# Patient Record
Sex: Female | Born: 1945 | Race: White | Hispanic: No | Marital: Married | Smoking: Never smoker
Health system: Southern US, Community
[De-identification: ages and names within clinical notes are randomized; demographics above are authoritative.]

## PROBLEM LIST (undated history)

## (undated) DIAGNOSIS — I10 Essential (primary) hypertension: Secondary | ICD-10-CM

## (undated) DIAGNOSIS — C50919 Malignant neoplasm of unspecified site of unspecified female breast: Secondary | ICD-10-CM

## (undated) DIAGNOSIS — C719 Malignant neoplasm of brain, unspecified: Secondary | ICD-10-CM

## (undated) DIAGNOSIS — C419 Malignant neoplasm of bone and articular cartilage, unspecified: Secondary | ICD-10-CM

## (undated) DIAGNOSIS — C569 Malignant neoplasm of unspecified ovary: Secondary | ICD-10-CM

## (undated) HISTORY — DX: Malignant neoplasm of unspecified site of unspecified female breast: C50.919

## (undated) HISTORY — DX: Essential (primary) hypertension: I10

## (undated) HISTORY — PX: OTHER SURGICAL HISTORY: SHX169

---

## 1995-02-07 DIAGNOSIS — C50919 Malignant neoplasm of unspecified site of unspecified female breast: Secondary | ICD-10-CM

## 1995-02-07 HISTORY — PX: OTHER SURGICAL HISTORY: SHX169

## 1995-02-07 HISTORY — PX: BREAST LUMPECTOMY WITH AXILLARY LYMPH NODE DISSECTION: SHX5756

## 1995-02-07 HISTORY — DX: Malignant neoplasm of unspecified site of unspecified female breast: C50.919

## 1998-11-26 ENCOUNTER — Other Ambulatory Visit: Admission: RE | Admit: 1998-11-26 | Discharge: 1998-11-26 | Payer: Self-pay | Admitting: Obstetrics & Gynecology

## 2000-02-17 ENCOUNTER — Other Ambulatory Visit: Admission: RE | Admit: 2000-02-17 | Discharge: 2000-02-17 | Payer: Self-pay | Admitting: Obstetrics & Gynecology

## 2000-02-24 ENCOUNTER — Ambulatory Visit (HOSPITAL_COMMUNITY): Admission: RE | Admit: 2000-02-24 | Discharge: 2000-02-24 | Payer: Self-pay | Admitting: Family Medicine

## 2000-02-24 ENCOUNTER — Encounter: Payer: Self-pay | Admitting: Family Medicine

## 2001-02-06 DIAGNOSIS — C569 Malignant neoplasm of unspecified ovary: Secondary | ICD-10-CM

## 2001-02-06 HISTORY — DX: Malignant neoplasm of unspecified ovary: C56.9

## 2001-02-06 HISTORY — PX: OTHER SURGICAL HISTORY: SHX169

## 2001-02-20 ENCOUNTER — Encounter (INDEPENDENT_AMBULATORY_CARE_PROVIDER_SITE_OTHER): Payer: Self-pay | Admitting: Specialist

## 2001-02-20 ENCOUNTER — Ambulatory Visit (HOSPITAL_COMMUNITY): Admission: RE | Admit: 2001-02-20 | Discharge: 2001-02-20 | Payer: Self-pay | Admitting: Gastroenterology

## 2001-03-28 ENCOUNTER — Other Ambulatory Visit: Admission: RE | Admit: 2001-03-28 | Discharge: 2001-03-28 | Payer: Self-pay | Admitting: Obstetrics & Gynecology

## 2001-04-26 ENCOUNTER — Ambulatory Visit (HOSPITAL_COMMUNITY): Admission: RE | Admit: 2001-04-26 | Discharge: 2001-04-26 | Payer: Self-pay | Admitting: Obstetrics & Gynecology

## 2001-04-26 ENCOUNTER — Encounter: Payer: Self-pay | Admitting: Obstetrics & Gynecology

## 2001-05-09 ENCOUNTER — Ambulatory Visit (HOSPITAL_COMMUNITY): Admission: RE | Admit: 2001-05-09 | Discharge: 2001-05-09 | Payer: Self-pay | Admitting: Obstetrics & Gynecology

## 2001-05-09 ENCOUNTER — Encounter (INDEPENDENT_AMBULATORY_CARE_PROVIDER_SITE_OTHER): Payer: Self-pay | Admitting: *Deleted

## 2001-05-21 ENCOUNTER — Ambulatory Visit: Admission: RE | Admit: 2001-05-21 | Discharge: 2001-05-21 | Payer: Self-pay | Admitting: Gynecology

## 2001-05-27 ENCOUNTER — Encounter: Payer: Self-pay | Admitting: Obstetrics & Gynecology

## 2001-05-28 ENCOUNTER — Inpatient Hospital Stay (HOSPITAL_COMMUNITY): Admission: RE | Admit: 2001-05-28 | Discharge: 2001-06-01 | Payer: Self-pay | Admitting: Obstetrics & Gynecology

## 2001-05-28 ENCOUNTER — Encounter (INDEPENDENT_AMBULATORY_CARE_PROVIDER_SITE_OTHER): Payer: Self-pay

## 2001-06-01 ENCOUNTER — Other Ambulatory Visit: Admission: RE | Admit: 2001-06-01 | Discharge: 2001-06-01 | Payer: Self-pay | Admitting: Obstetrics & Gynecology

## 2001-06-12 ENCOUNTER — Ambulatory Visit: Admission: RE | Admit: 2001-06-12 | Discharge: 2001-06-12 | Payer: Self-pay | Admitting: Gynecology

## 2001-07-09 ENCOUNTER — Ambulatory Visit: Admission: RE | Admit: 2001-07-09 | Discharge: 2001-07-09 | Payer: Self-pay | Admitting: Gynecology

## 2001-07-10 ENCOUNTER — Ambulatory Visit (HOSPITAL_COMMUNITY): Admission: RE | Admit: 2001-07-10 | Discharge: 2001-07-10 | Payer: Self-pay | Admitting: Gastroenterology

## 2001-07-22 ENCOUNTER — Ambulatory Visit (HOSPITAL_COMMUNITY): Admission: RE | Admit: 2001-07-22 | Discharge: 2001-07-22 | Payer: Self-pay | Admitting: Oncology

## 2001-07-22 ENCOUNTER — Encounter: Payer: Self-pay | Admitting: Oncology

## 2001-10-08 ENCOUNTER — Ambulatory Visit: Admission: RE | Admit: 2001-10-08 | Discharge: 2001-10-08 | Payer: Self-pay | Admitting: Gynecology

## 2001-12-18 ENCOUNTER — Ambulatory Visit (HOSPITAL_COMMUNITY): Admission: RE | Admit: 2001-12-18 | Discharge: 2001-12-18 | Payer: Self-pay | Admitting: Oncology

## 2001-12-18 ENCOUNTER — Encounter: Payer: Self-pay | Admitting: Oncology

## 2002-03-06 ENCOUNTER — Ambulatory Visit (HOSPITAL_COMMUNITY): Admission: RE | Admit: 2002-03-06 | Discharge: 2002-03-06 | Payer: Self-pay | Admitting: Oncology

## 2002-03-06 ENCOUNTER — Encounter: Payer: Self-pay | Admitting: Oncology

## 2002-03-18 ENCOUNTER — Ambulatory Visit: Admission: RE | Admit: 2002-03-18 | Discharge: 2002-03-18 | Payer: Self-pay | Admitting: Gynecology

## 2002-09-19 ENCOUNTER — Encounter: Payer: Self-pay | Admitting: Oncology

## 2002-09-19 ENCOUNTER — Ambulatory Visit (HOSPITAL_COMMUNITY): Admission: RE | Admit: 2002-09-19 | Discharge: 2002-09-19 | Payer: Self-pay | Admitting: Oncology

## 2002-10-09 ENCOUNTER — Ambulatory Visit (HOSPITAL_COMMUNITY): Admission: RE | Admit: 2002-10-09 | Discharge: 2002-10-09 | Payer: Self-pay | Admitting: Oncology

## 2002-10-09 ENCOUNTER — Encounter: Payer: Self-pay | Admitting: Oncology

## 2002-10-09 ENCOUNTER — Encounter (INDEPENDENT_AMBULATORY_CARE_PROVIDER_SITE_OTHER): Payer: Self-pay | Admitting: Specialist

## 2002-10-09 ENCOUNTER — Other Ambulatory Visit: Admission: RE | Admit: 2002-10-09 | Discharge: 2002-10-09 | Payer: Self-pay | Admitting: Interventional Radiology

## 2002-10-22 ENCOUNTER — Ambulatory Visit (HOSPITAL_COMMUNITY): Admission: RE | Admit: 2002-10-22 | Discharge: 2002-10-22 | Payer: Self-pay | Admitting: Oncology

## 2002-10-22 ENCOUNTER — Encounter: Payer: Self-pay | Admitting: Oncology

## 2002-10-30 ENCOUNTER — Ambulatory Visit (HOSPITAL_COMMUNITY): Admission: RE | Admit: 2002-10-30 | Discharge: 2002-10-30 | Payer: Self-pay | Admitting: Oncology

## 2002-10-30 ENCOUNTER — Encounter: Payer: Self-pay | Admitting: Oncology

## 2002-10-31 ENCOUNTER — Ambulatory Visit: Admission: RE | Admit: 2002-10-31 | Discharge: 2002-12-06 | Payer: Self-pay | Admitting: Radiation Oncology

## 2002-12-17 ENCOUNTER — Ambulatory Visit (HOSPITAL_COMMUNITY): Admission: RE | Admit: 2002-12-17 | Discharge: 2002-12-17 | Payer: Self-pay | Admitting: Oncology

## 2003-02-07 HISTORY — PX: COMBINED HYSTERECTOMY ABDOMINAL W/ A&P REPAIR / OOPHORECTOMY: SUR292

## 2003-02-16 ENCOUNTER — Inpatient Hospital Stay (HOSPITAL_COMMUNITY): Admission: RE | Admit: 2003-02-16 | Discharge: 2003-02-26 | Payer: Self-pay | Admitting: Thoracic Surgery

## 2003-02-16 ENCOUNTER — Encounter (INDEPENDENT_AMBULATORY_CARE_PROVIDER_SITE_OTHER): Payer: Self-pay | Admitting: *Deleted

## 2003-03-03 ENCOUNTER — Encounter: Admission: RE | Admit: 2003-03-03 | Discharge: 2003-03-03 | Payer: Self-pay | Admitting: Thoracic Surgery

## 2003-03-18 ENCOUNTER — Encounter: Admission: RE | Admit: 2003-03-18 | Discharge: 2003-03-18 | Payer: Self-pay | Admitting: Thoracic Surgery

## 2003-03-24 ENCOUNTER — Ambulatory Visit: Admission: RE | Admit: 2003-03-24 | Discharge: 2003-04-14 | Payer: Self-pay | Admitting: Radiation Oncology

## 2003-04-30 ENCOUNTER — Encounter: Admission: RE | Admit: 2003-04-30 | Discharge: 2003-04-30 | Payer: Self-pay | Admitting: Thoracic Surgery

## 2003-06-11 ENCOUNTER — Encounter: Admission: RE | Admit: 2003-06-11 | Discharge: 2003-06-11 | Payer: Self-pay | Admitting: Thoracic Surgery

## 2003-06-15 ENCOUNTER — Ambulatory Visit (HOSPITAL_COMMUNITY): Admission: RE | Admit: 2003-06-15 | Discharge: 2003-06-15 | Payer: Self-pay | Admitting: Oncology

## 2003-08-12 ENCOUNTER — Encounter: Admission: RE | Admit: 2003-08-12 | Discharge: 2003-08-12 | Payer: Self-pay | Admitting: Thoracic Surgery

## 2003-09-21 ENCOUNTER — Ambulatory Visit (HOSPITAL_COMMUNITY): Admission: RE | Admit: 2003-09-21 | Discharge: 2003-09-21 | Payer: Self-pay | Admitting: Oncology

## 2003-10-20 ENCOUNTER — Ambulatory Visit (HOSPITAL_COMMUNITY): Admission: RE | Admit: 2003-10-20 | Discharge: 2003-10-20 | Payer: Self-pay | Admitting: Oncology

## 2003-11-03 ENCOUNTER — Ambulatory Visit: Admission: RE | Admit: 2003-11-03 | Discharge: 2003-12-16 | Payer: Self-pay | Admitting: Radiation Oncology

## 2003-11-05 ENCOUNTER — Encounter: Admission: RE | Admit: 2003-11-05 | Discharge: 2003-11-05 | Payer: Self-pay | Admitting: Thoracic Surgery

## 2003-12-22 ENCOUNTER — Ambulatory Visit (HOSPITAL_COMMUNITY): Admission: RE | Admit: 2003-12-22 | Discharge: 2003-12-22 | Payer: Self-pay | Admitting: Oncology

## 2003-12-25 ENCOUNTER — Ambulatory Visit: Payer: Self-pay | Admitting: Oncology

## 2004-01-18 ENCOUNTER — Ambulatory Visit (HOSPITAL_COMMUNITY): Admission: RE | Admit: 2004-01-18 | Discharge: 2004-01-18 | Payer: Self-pay | Admitting: Oncology

## 2004-01-19 ENCOUNTER — Ambulatory Visit (HOSPITAL_COMMUNITY): Admission: RE | Admit: 2004-01-19 | Discharge: 2004-01-19 | Payer: Self-pay | Admitting: Oncology

## 2004-02-07 DIAGNOSIS — C419 Malignant neoplasm of bone and articular cartilage, unspecified: Secondary | ICD-10-CM

## 2004-02-07 HISTORY — DX: Malignant neoplasm of bone and articular cartilage, unspecified: C41.9

## 2004-02-22 ENCOUNTER — Ambulatory Visit: Payer: Self-pay | Admitting: Oncology

## 2004-02-24 ENCOUNTER — Encounter
Admission: RE | Admit: 2004-02-24 | Discharge: 2004-05-19 | Payer: Self-pay | Admitting: Physical Medicine and Rehabilitation

## 2004-02-26 ENCOUNTER — Ambulatory Visit: Payer: Self-pay | Admitting: Physical Medicine and Rehabilitation

## 2004-03-08 ENCOUNTER — Encounter: Admission: RE | Admit: 2004-03-08 | Discharge: 2004-03-08 | Payer: Self-pay | Admitting: Thoracic Surgery

## 2004-04-06 ENCOUNTER — Ambulatory Visit: Payer: Self-pay | Admitting: Physical Medicine and Rehabilitation

## 2004-04-14 ENCOUNTER — Ambulatory Visit: Payer: Self-pay | Admitting: Oncology

## 2004-04-18 ENCOUNTER — Ambulatory Visit (HOSPITAL_COMMUNITY): Admission: RE | Admit: 2004-04-18 | Discharge: 2004-04-18 | Payer: Self-pay | Admitting: Oncology

## 2004-05-19 ENCOUNTER — Encounter
Admission: RE | Admit: 2004-05-19 | Discharge: 2004-08-17 | Payer: Self-pay | Admitting: Physical Medicine and Rehabilitation

## 2004-05-25 ENCOUNTER — Ambulatory Visit: Payer: Self-pay | Admitting: Physical Medicine and Rehabilitation

## 2004-06-21 ENCOUNTER — Ambulatory Visit: Payer: Self-pay | Admitting: Oncology

## 2004-07-18 ENCOUNTER — Ambulatory Visit (HOSPITAL_COMMUNITY): Admission: RE | Admit: 2004-07-18 | Discharge: 2004-07-18 | Payer: Self-pay | Admitting: Oncology

## 2004-07-26 ENCOUNTER — Ambulatory Visit: Payer: Self-pay | Admitting: Physical Medicine and Rehabilitation

## 2004-08-18 ENCOUNTER — Encounter
Admission: RE | Admit: 2004-08-18 | Discharge: 2004-08-18 | Payer: Self-pay | Admitting: Physical Medicine and Rehabilitation

## 2004-08-22 ENCOUNTER — Encounter
Admission: RE | Admit: 2004-08-22 | Discharge: 2004-11-20 | Payer: Self-pay | Admitting: Physical Medicine and Rehabilitation

## 2004-08-23 ENCOUNTER — Ambulatory Visit: Payer: Self-pay | Admitting: Oncology

## 2004-09-03 ENCOUNTER — Ambulatory Visit (HOSPITAL_COMMUNITY): Admission: RE | Admit: 2004-09-03 | Discharge: 2004-09-03 | Payer: Self-pay | Admitting: Oncology

## 2004-09-12 ENCOUNTER — Ambulatory Visit (HOSPITAL_COMMUNITY): Admission: RE | Admit: 2004-09-12 | Discharge: 2004-09-12 | Payer: Self-pay | Admitting: Oncology

## 2004-09-14 ENCOUNTER — Ambulatory Visit (HOSPITAL_COMMUNITY): Admission: RE | Admit: 2004-09-14 | Discharge: 2004-09-14 | Payer: Self-pay | Admitting: Oncology

## 2004-09-16 ENCOUNTER — Ambulatory Visit: Admission: RE | Admit: 2004-09-16 | Discharge: 2004-10-26 | Payer: Self-pay | Admitting: Oncology

## 2004-09-26 DIAGNOSIS — C50919 Malignant neoplasm of unspecified site of unspecified female breast: Secondary | ICD-10-CM | POA: Insufficient documentation

## 2004-09-27 ENCOUNTER — Ambulatory Visit: Payer: Self-pay | Admitting: Physical Medicine and Rehabilitation

## 2004-10-24 ENCOUNTER — Ambulatory Visit: Payer: Self-pay | Admitting: Oncology

## 2004-10-28 ENCOUNTER — Ambulatory Visit: Payer: Self-pay | Admitting: Physical Medicine and Rehabilitation

## 2004-11-18 ENCOUNTER — Encounter
Admission: RE | Admit: 2004-11-18 | Discharge: 2005-02-16 | Payer: Self-pay | Admitting: Physical Medicine and Rehabilitation

## 2004-12-14 ENCOUNTER — Ambulatory Visit: Payer: Self-pay | Admitting: Oncology

## 2004-12-23 ENCOUNTER — Ambulatory Visit: Payer: Self-pay | Admitting: Physical Medicine and Rehabilitation

## 2005-01-31 ENCOUNTER — Ambulatory Visit: Payer: Self-pay | Admitting: Oncology

## 2005-02-08 ENCOUNTER — Ambulatory Visit (HOSPITAL_COMMUNITY): Admission: RE | Admit: 2005-02-08 | Discharge: 2005-02-08 | Payer: Self-pay | Admitting: Oncology

## 2005-02-20 ENCOUNTER — Ambulatory Visit: Payer: Self-pay | Admitting: Physical Medicine and Rehabilitation

## 2005-02-20 ENCOUNTER — Encounter
Admission: RE | Admit: 2005-02-20 | Discharge: 2005-05-21 | Payer: Self-pay | Admitting: Physical Medicine and Rehabilitation

## 2005-03-20 ENCOUNTER — Ambulatory Visit: Payer: Self-pay | Admitting: Oncology

## 2005-03-22 ENCOUNTER — Encounter
Admission: RE | Admit: 2005-03-22 | Discharge: 2005-04-14 | Payer: Self-pay | Admitting: Physical Medicine and Rehabilitation

## 2005-04-18 ENCOUNTER — Ambulatory Visit: Payer: Self-pay | Admitting: Physical Medicine and Rehabilitation

## 2005-05-22 ENCOUNTER — Encounter
Admission: RE | Admit: 2005-05-22 | Discharge: 2005-08-20 | Payer: Self-pay | Admitting: Physical Medicine and Rehabilitation

## 2005-05-22 ENCOUNTER — Ambulatory Visit: Payer: Self-pay | Admitting: Physical Medicine & Rehabilitation

## 2005-05-25 ENCOUNTER — Encounter
Admission: RE | Admit: 2005-05-25 | Discharge: 2005-08-23 | Payer: Self-pay | Admitting: Physical Medicine & Rehabilitation

## 2005-06-20 ENCOUNTER — Ambulatory Visit: Payer: Self-pay | Admitting: Physical Medicine and Rehabilitation

## 2005-07-13 ENCOUNTER — Ambulatory Visit: Payer: Self-pay | Admitting: Oncology

## 2005-08-08 ENCOUNTER — Ambulatory Visit: Payer: Self-pay | Admitting: Pulmonary Disease

## 2005-08-11 ENCOUNTER — Encounter: Admission: RE | Admit: 2005-08-11 | Discharge: 2005-11-09 | Payer: Self-pay | Admitting: Anesthesiology

## 2005-08-15 ENCOUNTER — Ambulatory Visit: Payer: Self-pay | Admitting: Anesthesiology

## 2005-08-17 ENCOUNTER — Encounter
Admission: RE | Admit: 2005-08-17 | Discharge: 2005-11-15 | Payer: Self-pay | Admitting: Physical Medicine and Rehabilitation

## 2005-08-17 ENCOUNTER — Ambulatory Visit: Payer: Self-pay | Admitting: Physical Medicine and Rehabilitation

## 2005-10-12 ENCOUNTER — Ambulatory Visit: Payer: Self-pay | Admitting: Physical Medicine and Rehabilitation

## 2005-11-09 ENCOUNTER — Encounter
Admission: RE | Admit: 2005-11-09 | Discharge: 2006-02-07 | Payer: Self-pay | Admitting: Physical Medicine and Rehabilitation

## 2005-12-08 ENCOUNTER — Ambulatory Visit: Payer: Self-pay | Admitting: Physical Medicine and Rehabilitation

## 2006-02-02 ENCOUNTER — Ambulatory Visit: Payer: Self-pay | Admitting: Physical Medicine and Rehabilitation

## 2006-02-28 ENCOUNTER — Encounter
Admission: RE | Admit: 2006-02-28 | Discharge: 2006-05-29 | Payer: Self-pay | Admitting: Physical Medicine and Rehabilitation

## 2006-03-11 IMAGING — CR DG CHEST 2V
2 series · 2 of 2 positions shown · non-contrast
Comparison: 03/18/03.

CLINICAL DATA: Lung cancer ? followup. 
 TWO VIEW CHEST

[view not recorded (1 of 2)]
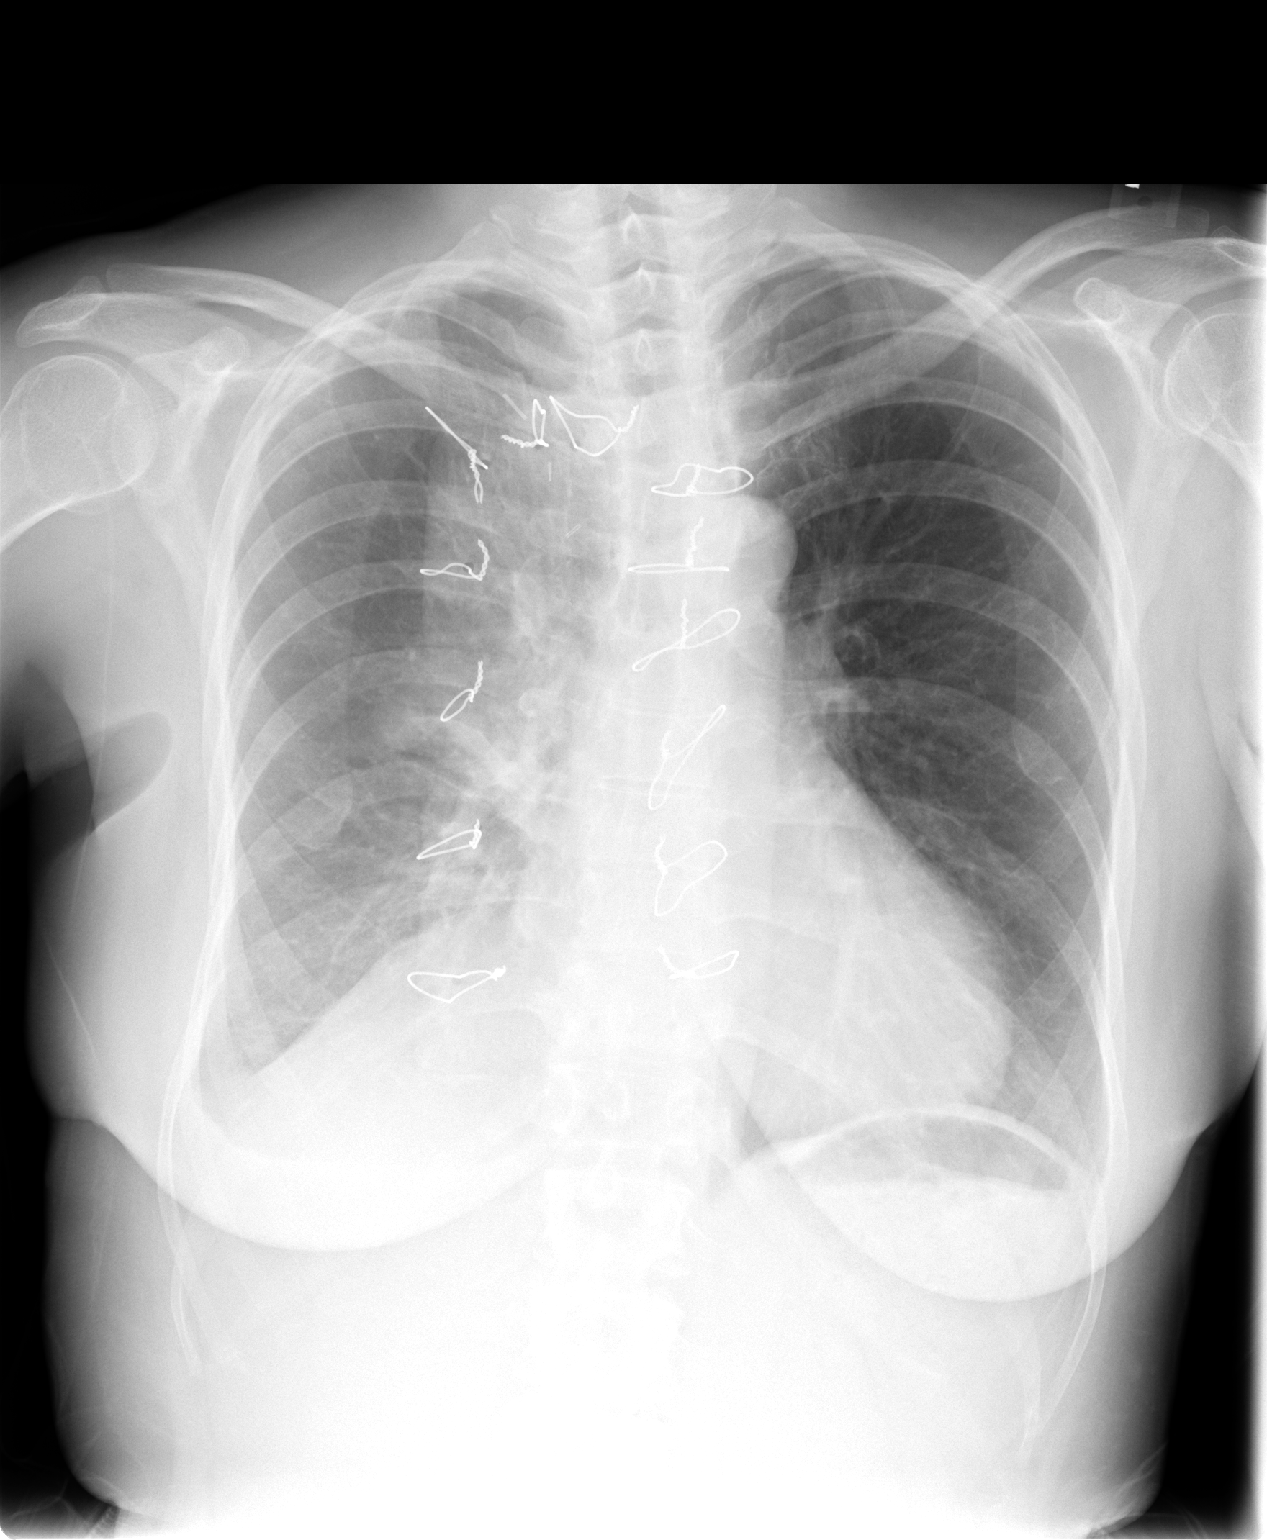

[view not recorded (2 of 2)]
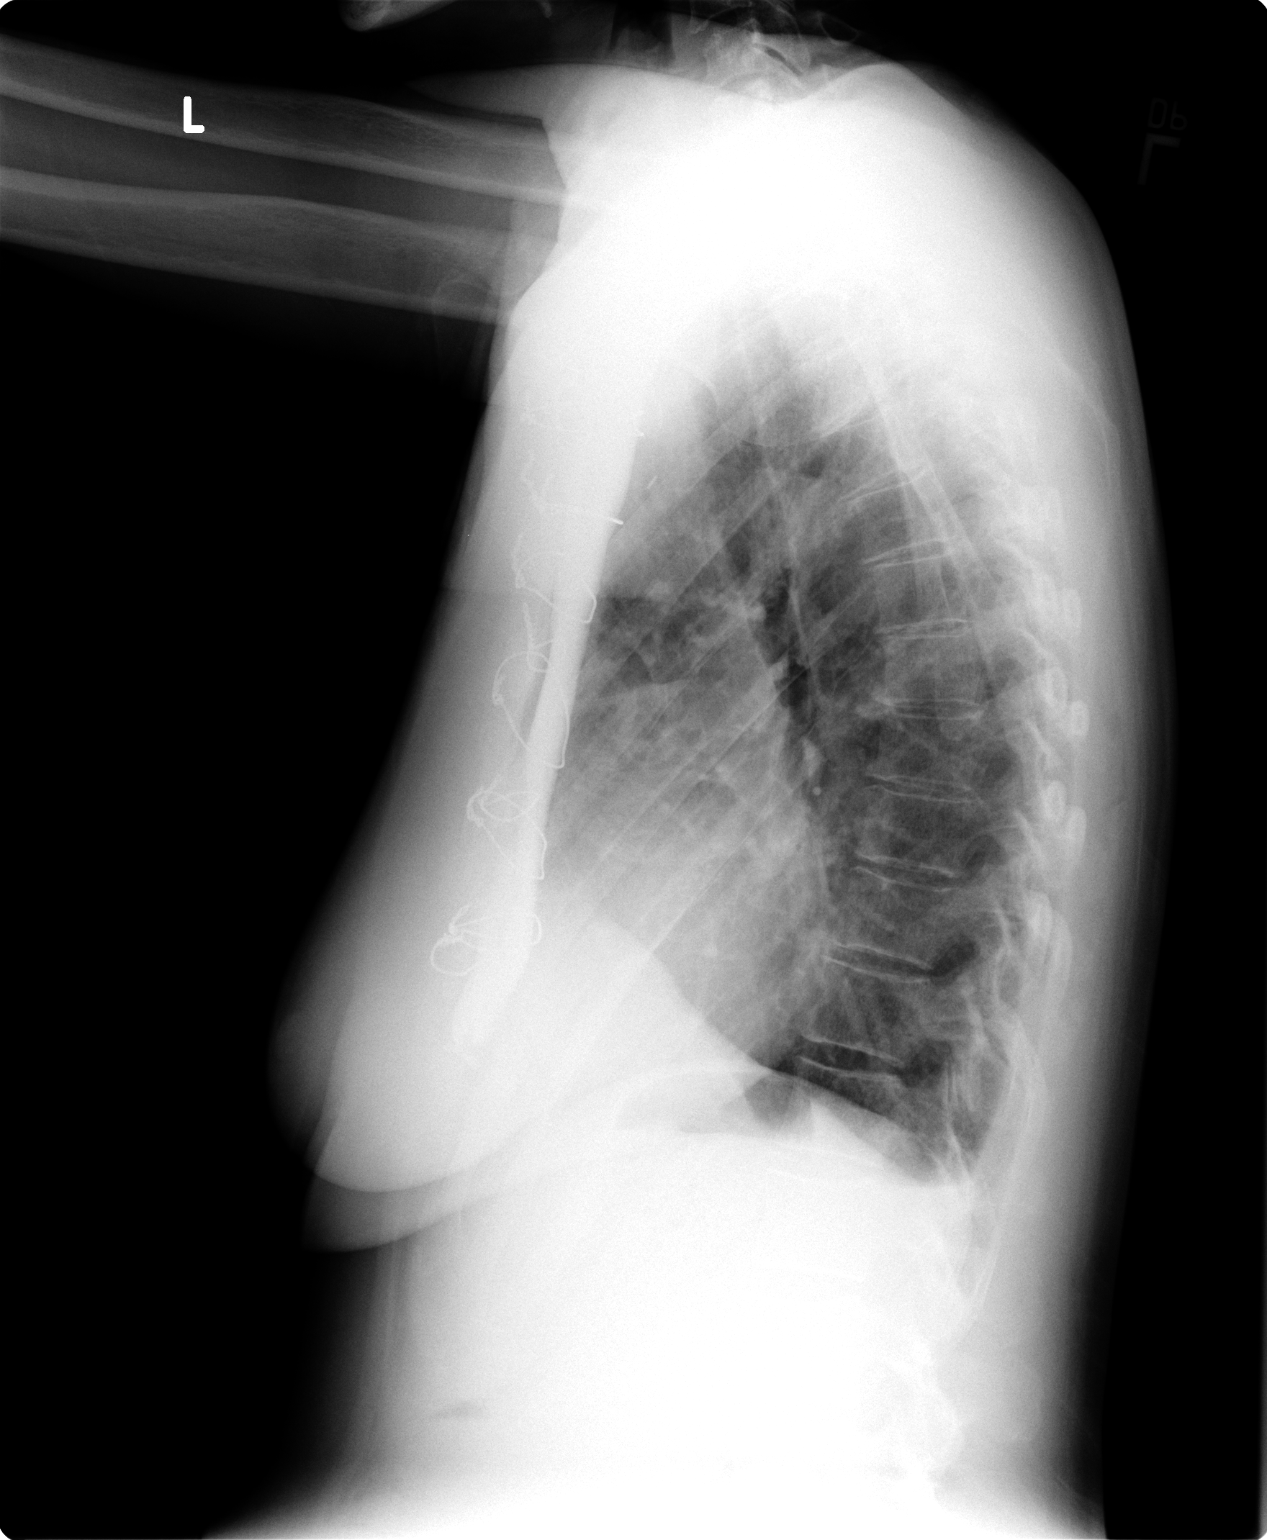

[2 of 2 positions shown; findings below may reference images not displayed]

Patient appears to have undergone a resection of the right anterior rib cage.  Post-op pleural and parenchymal changes appear stable.  Left lung clear. 
 IMPRESSION
 Post-operative changes on the right with apparent anterior rib cage resection.  No active disease or interval change.

## 2006-03-29 ENCOUNTER — Ambulatory Visit: Payer: Self-pay | Admitting: Physical Medicine and Rehabilitation

## 2006-05-21 ENCOUNTER — Ambulatory Visit: Payer: Self-pay | Admitting: Physical Medicine and Rehabilitation

## 2006-06-26 ENCOUNTER — Ambulatory Visit: Payer: Self-pay | Admitting: Physical Medicine and Rehabilitation

## 2006-06-26 ENCOUNTER — Encounter
Admission: RE | Admit: 2006-06-26 | Discharge: 2006-09-24 | Payer: Self-pay | Admitting: Physical Medicine and Rehabilitation

## 2006-07-04 ENCOUNTER — Ambulatory Visit: Payer: Self-pay | Admitting: Physical Medicine and Rehabilitation

## 2006-08-02 IMAGING — CT NM PET TUM IMG SKULL BASE T - THIGH
3 series · 25 of 25 positions shown · IV contrast ([ID])
Comparison: none

CLINICAL DATA: Breast cancer, restaging.
 FDG PET-CT TUMOR IMAGING (SKULL BASE TO THIGHS) ? 09/21/03 
 Fasting Blood Glucose:  92.
TECHNIQUE: 16.4 mCi F18-FDG were administered via the left antecubital vein.  Full ring PET imaging was performed from the skull base through the mid-thighs 60 minutes after injection.  CT data was obtained and used for attenuation correction and anatomic localization only.  (This was not acquired as a diagnostic CT examination.
 Comparison to PET scan and CT scan of 06/15/03.

[Series 1: pet ac · axial · 3.3mm · 4.69mm/px · z∈[-870,+0]mm · 9 of 267 slices shown]
[im 1/267]
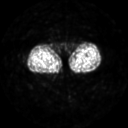
[im 34/267]
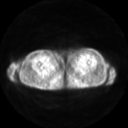
[im 67/267]
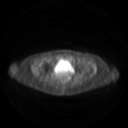
[im 100/267]
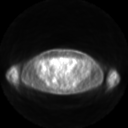
[im 134/267]
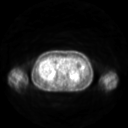
[im 167/267]
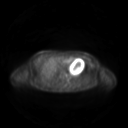
[im 200/267]
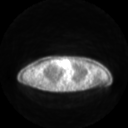
[im 233/267]
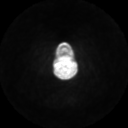
[im 267/267]
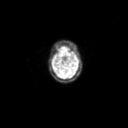

[Series 2: ct images · axial · 3.8mm · 0.98mm/px · z∈[-870,+0]mm · 8 of 267 slices shown]
[im 1/267]
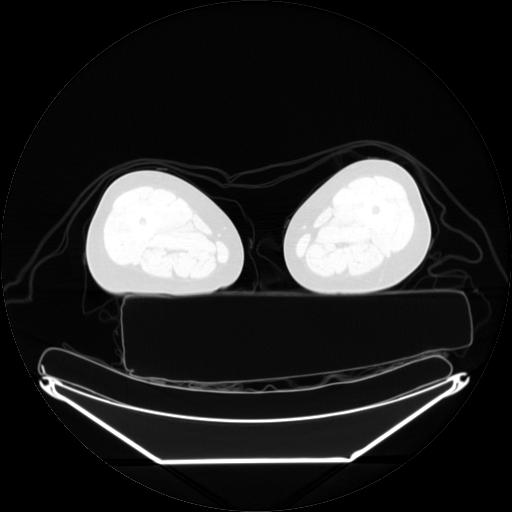
[im 39/267]
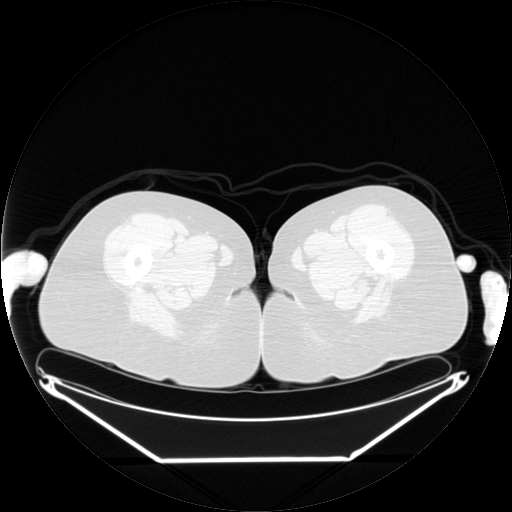
[im 77/267]
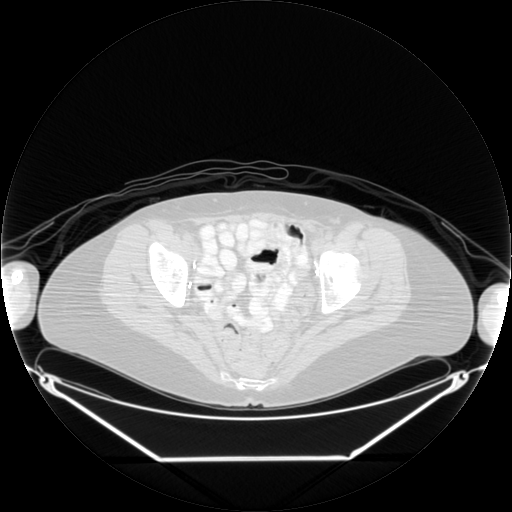
[im 115/267]
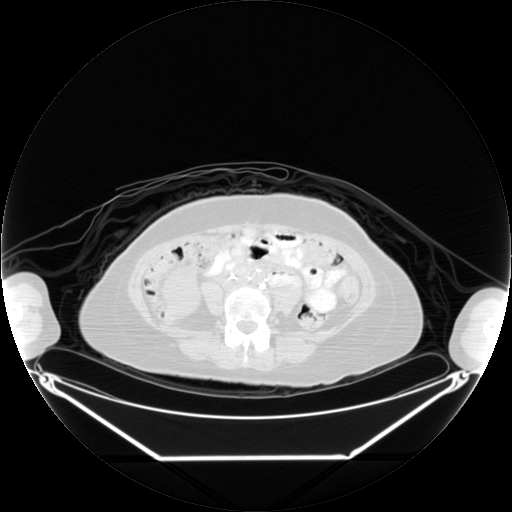
[im 153/267]
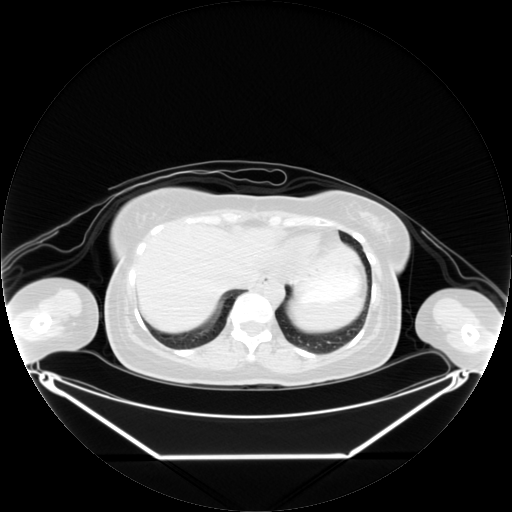
[im 191/267]
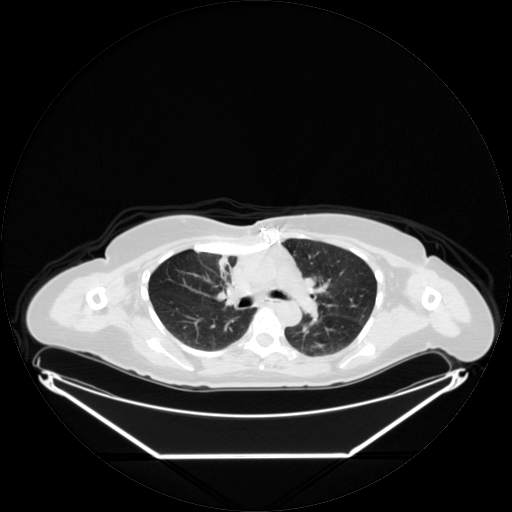
[im 229/267]
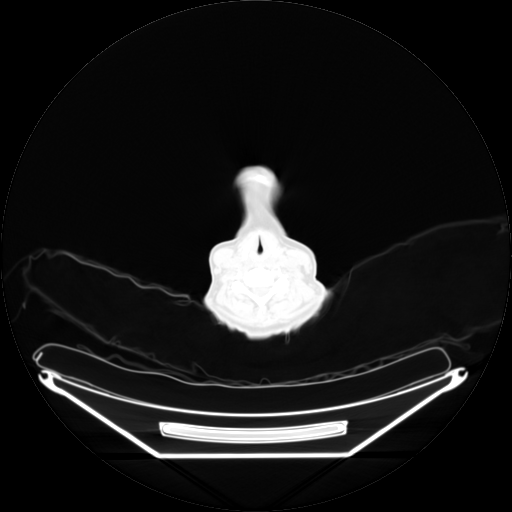
[im 267/267]
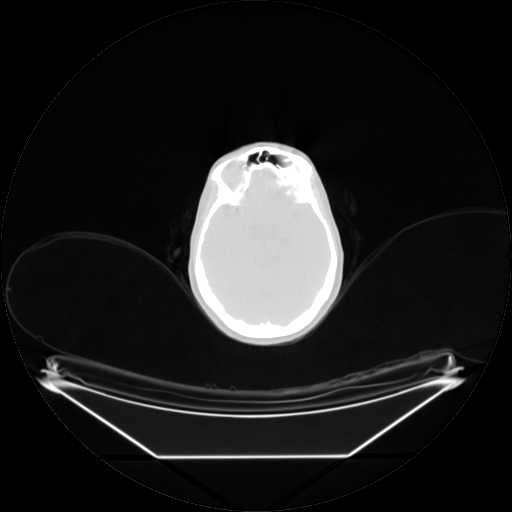

[Series 2: pet nac · axial · 3.3mm · 4.69mm/px · z∈[-870,+0]mm · 8 of 267 slices shown]
[im 1/267]
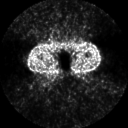
[im 39/267]
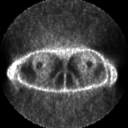
[im 77/267]
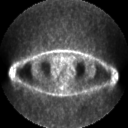
[im 115/267]
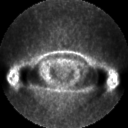
[im 153/267]
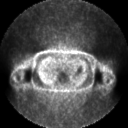
[im 191/267]
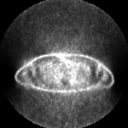
[im 229/267]
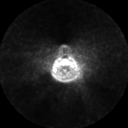
[im 267/267]
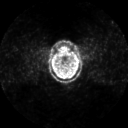

[25 of 25 positions shown; findings below may reference images not displayed]

FINDINGS: There is a subtle area of increased activity noted in the right sacral ala.  The maximum SUV currently is 4.2.  On the prior study, this area was difficult to see, with a maximum SUV of 3.2.  On the corresponding CT images, there is no abnormality seen in this area by CT imaging.  With this low-level activity, it is difficult to know the significance of this finding.  Recommend attention to this area on follow-up imaging.
 No other areas of abnormal activity in the neck, chest, abdomen, or pelvis.
 IMPRESSION
 Subtle area of increased activity in the right sacral ala, slightly increased in maximum SUV since the prior study, now at 4.2.  No abnormality is seen in this area on corresponding CT images.  It is difficult to completely exclude an early/subtle metastatic focus.  Recommend attention to this area on follow-up imaging.

## 2006-08-29 ENCOUNTER — Ambulatory Visit: Payer: Self-pay | Admitting: Physical Medicine and Rehabilitation

## 2006-09-26 ENCOUNTER — Encounter
Admission: RE | Admit: 2006-09-26 | Discharge: 2006-12-25 | Payer: Self-pay | Admitting: Physical Medicine and Rehabilitation

## 2006-10-23 ENCOUNTER — Ambulatory Visit: Payer: Self-pay | Admitting: Physical Medicine and Rehabilitation

## 2006-12-18 ENCOUNTER — Ambulatory Visit: Payer: Self-pay | Admitting: Physical Medicine & Rehabilitation

## 2006-12-19 ENCOUNTER — Ambulatory Visit: Payer: Self-pay | Admitting: Physical Medicine and Rehabilitation

## 2007-01-15 ENCOUNTER — Ambulatory Visit: Payer: Self-pay | Admitting: Physical Medicine and Rehabilitation

## 2007-01-15 ENCOUNTER — Encounter
Admission: RE | Admit: 2007-01-15 | Discharge: 2007-01-16 | Payer: Self-pay | Admitting: Physical Medicine and Rehabilitation

## 2007-02-19 ENCOUNTER — Ambulatory Visit: Payer: Self-pay | Admitting: Physical Medicine and Rehabilitation

## 2007-02-19 ENCOUNTER — Encounter
Admission: RE | Admit: 2007-02-19 | Discharge: 2007-05-20 | Payer: Self-pay | Admitting: Physical Medicine and Rehabilitation

## 2007-03-13 ENCOUNTER — Ambulatory Visit: Payer: Self-pay | Admitting: Physical Medicine and Rehabilitation

## 2007-04-02 ENCOUNTER — Inpatient Hospital Stay (HOSPITAL_COMMUNITY): Admission: EM | Admit: 2007-04-02 | Discharge: 2007-04-05 | Payer: Self-pay | Admitting: Emergency Medicine

## 2007-04-16 ENCOUNTER — Ambulatory Visit: Payer: Self-pay | Admitting: Physical Medicine and Rehabilitation

## 2007-05-10 ENCOUNTER — Ambulatory Visit: Payer: Self-pay | Admitting: Physical Medicine and Rehabilitation

## 2007-06-05 ENCOUNTER — Encounter
Admission: RE | Admit: 2007-06-05 | Discharge: 2007-07-31 | Payer: Self-pay | Admitting: Physical Medicine and Rehabilitation

## 2007-06-07 ENCOUNTER — Ambulatory Visit: Payer: Self-pay | Admitting: Physical Medicine and Rehabilitation

## 2007-07-05 ENCOUNTER — Ambulatory Visit: Payer: Self-pay | Admitting: Physical Medicine and Rehabilitation

## 2007-07-31 ENCOUNTER — Ambulatory Visit: Payer: Self-pay | Admitting: Physical Medicine and Rehabilitation

## 2007-08-28 ENCOUNTER — Encounter
Admission: RE | Admit: 2007-08-28 | Discharge: 2007-11-01 | Payer: Self-pay | Admitting: Physical Medicine and Rehabilitation

## 2007-08-30 ENCOUNTER — Ambulatory Visit: Payer: Self-pay | Admitting: Physical Medicine and Rehabilitation

## 2007-10-04 ENCOUNTER — Ambulatory Visit: Payer: Self-pay | Admitting: Physical Medicine and Rehabilitation

## 2007-11-01 ENCOUNTER — Ambulatory Visit: Payer: Self-pay | Admitting: Physical Medicine and Rehabilitation

## 2007-12-02 ENCOUNTER — Encounter
Admission: RE | Admit: 2007-12-02 | Discharge: 2008-03-01 | Payer: Self-pay | Admitting: Physical Medicine and Rehabilitation

## 2007-12-04 ENCOUNTER — Ambulatory Visit: Payer: Self-pay | Admitting: Physical Medicine and Rehabilitation

## 2007-12-31 ENCOUNTER — Ambulatory Visit: Payer: Self-pay | Admitting: Physical Medicine and Rehabilitation

## 2008-01-28 ENCOUNTER — Ambulatory Visit: Payer: Self-pay | Admitting: Physical Medicine and Rehabilitation

## 2008-02-26 ENCOUNTER — Ambulatory Visit: Payer: Self-pay | Admitting: Physical Medicine and Rehabilitation

## 2008-03-24 ENCOUNTER — Encounter
Admission: RE | Admit: 2008-03-24 | Discharge: 2008-06-22 | Payer: Self-pay | Admitting: Physical Medicine and Rehabilitation

## 2008-03-25 ENCOUNTER — Ambulatory Visit: Payer: Self-pay | Admitting: Physical Medicine and Rehabilitation

## 2008-04-22 ENCOUNTER — Ambulatory Visit: Payer: Self-pay | Admitting: Physical Medicine and Rehabilitation

## 2008-06-03 ENCOUNTER — Ambulatory Visit: Payer: Self-pay | Admitting: Physical Medicine and Rehabilitation

## 2008-06-25 ENCOUNTER — Encounter
Admission: RE | Admit: 2008-06-25 | Discharge: 2008-08-04 | Payer: Self-pay | Admitting: Physical Medicine and Rehabilitation

## 2008-06-25 ENCOUNTER — Encounter
Admission: RE | Admit: 2008-06-25 | Discharge: 2008-07-22 | Payer: Self-pay | Admitting: Physical Medicine and Rehabilitation

## 2008-07-03 ENCOUNTER — Ambulatory Visit: Payer: Self-pay | Admitting: Physical Medicine and Rehabilitation

## 2008-08-05 ENCOUNTER — Ambulatory Visit: Payer: Self-pay | Admitting: Physical Medicine and Rehabilitation

## 2008-08-05 ENCOUNTER — Encounter
Admission: RE | Admit: 2008-08-05 | Discharge: 2008-10-27 | Payer: Self-pay | Admitting: Physical Medicine and Rehabilitation

## 2008-09-03 ENCOUNTER — Ambulatory Visit: Payer: Self-pay | Admitting: Physical Medicine and Rehabilitation

## 2008-10-02 ENCOUNTER — Ambulatory Visit: Payer: Self-pay | Admitting: Physical Medicine and Rehabilitation

## 2008-10-27 ENCOUNTER — Encounter
Admission: RE | Admit: 2008-10-27 | Discharge: 2009-01-25 | Payer: Self-pay | Admitting: Physical Medicine and Rehabilitation

## 2008-10-28 ENCOUNTER — Ambulatory Visit: Payer: Self-pay | Admitting: Physical Medicine and Rehabilitation

## 2008-11-25 ENCOUNTER — Ambulatory Visit: Payer: Self-pay | Admitting: Physical Medicine and Rehabilitation

## 2008-12-21 ENCOUNTER — Ambulatory Visit: Payer: Self-pay | Admitting: Physical Medicine and Rehabilitation

## 2009-01-04 ENCOUNTER — Ambulatory Visit: Payer: Self-pay | Admitting: Physical Medicine and Rehabilitation

## 2009-01-25 ENCOUNTER — Encounter
Admission: RE | Admit: 2009-01-25 | Discharge: 2009-02-03 | Payer: Self-pay | Admitting: Physical Medicine and Rehabilitation

## 2009-02-02 ENCOUNTER — Ambulatory Visit: Payer: Self-pay | Admitting: Physical Medicine and Rehabilitation

## 2009-03-02 ENCOUNTER — Encounter
Admission: RE | Admit: 2009-03-02 | Discharge: 2009-04-07 | Payer: Self-pay | Admitting: Physical Medicine & Rehabilitation

## 2009-03-03 ENCOUNTER — Ambulatory Visit: Payer: Self-pay | Admitting: Physical Medicine and Rehabilitation

## 2009-03-31 ENCOUNTER — Encounter
Admission: RE | Admit: 2009-03-31 | Discharge: 2009-06-29 | Payer: Self-pay | Admitting: Physical Medicine and Rehabilitation

## 2009-03-31 ENCOUNTER — Ambulatory Visit: Payer: Self-pay | Admitting: Physical Medicine and Rehabilitation

## 2009-04-28 ENCOUNTER — Ambulatory Visit: Payer: Self-pay | Admitting: Physical Medicine and Rehabilitation

## 2009-06-02 ENCOUNTER — Ambulatory Visit: Payer: Self-pay | Admitting: Physical Medicine and Rehabilitation

## 2009-06-30 ENCOUNTER — Ambulatory Visit: Payer: Self-pay | Admitting: Physical Medicine and Rehabilitation

## 2009-06-30 ENCOUNTER — Encounter
Admission: RE | Admit: 2009-06-30 | Discharge: 2009-09-17 | Payer: Self-pay | Admitting: Physical Medicine and Rehabilitation

## 2009-07-28 ENCOUNTER — Ambulatory Visit: Payer: Self-pay | Admitting: Physical Medicine and Rehabilitation

## 2009-08-27 ENCOUNTER — Ambulatory Visit: Payer: Self-pay | Admitting: Physical Medicine and Rehabilitation

## 2009-09-17 ENCOUNTER — Encounter
Admission: RE | Admit: 2009-09-17 | Discharge: 2009-09-17 | Payer: Self-pay | Admitting: Physical Medicine and Rehabilitation

## 2010-02-26 ENCOUNTER — Encounter: Payer: Self-pay | Admitting: Oncology

## 2010-02-27 ENCOUNTER — Encounter: Payer: Self-pay | Admitting: Thoracic Surgery

## 2010-02-27 ENCOUNTER — Encounter: Payer: Self-pay | Admitting: Oncology

## 2010-06-21 NOTE — Assessment & Plan Note (Signed)
HISTORY:  The patient is a 65 year old married woman who is being seen  in our [facility] for pain complaints related to  metastatic breast cancer.  She is back in today for a refill of her  medications.   At the last visit, we had discussed possibly switching her from morphine  IR to Percocet.  She would like to trial this for a week before getting  a months supply.  We discussed advantages and disadvantages of these  various pain medications with her today, spending approximately a half  of an hour.   She states her average pain is about a 4 to 4-1/2 on a scale of 10,  predominantly located in the right chest wall region and an area also  localized to the sacral region.  Pain is worse with sitting in the  sacral region and worse with movement of the right shoulder and the  right chest wall region.   She denies any new numbness, tingling, weakness.  She denies any new  balance problems.  Denies problems controlling bowel or bladder.  She is  reporting fair relief with current medications that she is on.   Medications prescribed by this clinic include the following:  1. Morphine sulfate IR 15 mg one p.o. t.i.d.  2. Lyrica 75 mg b.i.d.  3. Kadian 80 mg one p.o. b.i.d.   She also uses ibuprofen on a p.r.n. basis as well and p.r.n. Tylenol for  not more than 2,000 mg per day.   She denies any problems regarding depression, anxiety or suicidal  ideation.  She does note some easy bleeding.   PAST MEDICAL HISTORY:  Remarkable for a recent change in her  chemotherapy.  She began a new chemotherapy regime on the 1st of this  month.  She brings in CT scan which was done with contrast of the chest,  mediastinum, lungs, abdomen, and pelvis.  The report is attached to the  chart.  Briefly; however, she was noted to have a right sacral ala  pathologic fracture consistent with metastatic disease as well as  increasing size of liver metastatic.   PHYSICAL EXAMINATION:   VITAL SIGNS:  Blood pressure:  143/80.  Pulse:  86.  Respirations:  16.  96% saturate on room air.  GENERAL:  She is a well-developed, well-nourished female who appears her  stated age.  She is oriented x 3.  Her affect is bright, alert.  She is  alert and cooperative and pleasant.  She follows commands without  difficulty.  Her speech is clear.  SKIN:  Noted for some erythematous changes around the nose and mouth  regions and erythema of the hands.  MUSCULOSKELETAL:  She is able to stand easily in transitioning from sit  to stand without any problems.  Her gait in the room is within normal  limits.  She is able to tandem gait, perform Romberg's test without any  difficulty.  She has limitations in the right shoulder which are not  new.  Full shoulder motion in the left shoulder.  Reflexes are symmetric  and intact in the right and left upper extremities.  They are 2+ at the  biceps, triceps, brachial radialis, 1+ at patellar and Achilles'  tendons.  Motor strength is good throughout both upper and lower  extremities.  She does report increased pain with shoulder abduction and  is unable to give full strength.  She reports no sensory deficits with  light touch in the right or left  upper extremities today.  Palpation  down the cervical, thoracic, lumbar spine does not reveal any increased  tenderness.   IMPRESSION:  1. Breast cancer diagnosed in 1997 with metastasis in the past to      sacrum and liver currently with new pathologic fracture of the      right sacral ala.  She is currently followed by Dr. Rolly Salter over      at [facility] in [place] and undergoing a new      chemotherapy regime starting 06/07/2006.  2. 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  3. Limitations of right shoulder are multifactorial in origin.  4. New right sacral alar fracture diagnosed on computerized tomography      scan 06/07/2006.  5. History of  coccydynia.  6. Status post axillary dissection in the past.   PLAN:  We will refill her Kadian today 80 mg one p.o. b.i.d. # 60, and  we will refill her Lyrica as well 75 mg one p.o. b.i.d. #68 with 3  refills.  We will switch her from MS IR to Percocet 10/325, one p.o.  q.i.d. #28.  We will see her back next week to see how she is doing on  the switch from MS IR to Percocet.           ______________________________  Brantley Stage, M.D.     DMK/MedQ  D:  06/27/2006 09:59:34  T:  06/27/2006 10:59:29  Job #:  [id number]

## 2010-06-21 NOTE — Assessment & Plan Note (Signed)
Mariah Grimes is a 65 year old married female who is being followed in our  [facility]  for pain related to her metastatic  breast cancer.  However, her chief complaint today is right foot pain.   Her average pain is about a 4 on a scale of 10.  Pain is described as  constant and dull in the chest wall and sacral area.  The right foot is  worse when she walks on it.  Has been getting gradually worse over the  last several weeks, especially after she started wearing sandals.   Her sleep tends to be fair.  Her pain is worse with walking, bending,  and standing.  Improves with rest and medication.  She gets fair relief  with Grimes medications that she is on.   MEDICATIONS FROM THIS CLINIC:  1. Immediate release morphine sulfate 1 p.o. t.i.d.  2. Lyrica 75 mg 1 p.o. b.i.d.  3. Kadian 80 mg 1 p.o. b.i.d.   She brings back a prescription that I wrote out last month for 60 mg of  Kadian twice a day.  She states that she really does not want to go down  on her morphine at this time.   REVIEW OF SYSTEMS:  Otherwise negative.  She still continues to have  numbness in the feet and tingling in the feet.  No other medical  problems at this point.   SOCIAL/FAMILY HISTORY:  Otherwise unchanged.  She states she is planning  to get some counseling with her husband however.   EXAMINATION:  Blood pressure 128/69, pulse 108, respiration 18, 97%  saturated on room air.  She is a well developed, well nourished female  who does not appear in any distress.  She is oriented x 3.  Her speech  is clear  Her affect is bright.  She is alert, cooperative and pleasant.  She follows commands without any difficulty.   Transitioning from sitting to standing is done with ease.  Her gait in  the room is slightly antalgic today.  She has limitations in cervical  and shoulder motion.  Reflexes are 1+ in the upper extremities at biceps, triceps,  brachioradialis.  Zero at the patellar and Achilles  tendons.  She has  good strength in the upper and lower extremities without focal deficit.   Balance is good.  Romberg's test is performed without difficulty.  Examination today of the right foot reveals tenderness in the medial  heel and throughout the medial arch.  She has very minimal tenderness in  the Achilles tendon region.  Skin is tact.  Good pulses are noted.  No focal weakness is noted around  the ankle.   She does have decreased sensation patchy throughout the lower  extremities.   IMPRESSION:  1. New problem of plantar fasciitis on the right.  2. Breast cancer diagnosed 1997 with metastases to the sacrum, liver      and anterior ribs, currently under the care of Dr. Virginia Rochester,      undergoing chemotherapy.  3. Paresthesias and erythema of the bilateral hands and feet over the      last couple of months attributed to chemotherapy.  4. 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  Pain is worse with activity.  5. Decreased shoulder range of motion, which is multifactorial in      nature.  6. Status post radiation of the right sacral region with history of  pathologic fracture.  7. Intermittent constipation.   PLAN:  1. We will start her in some physical therapy to do modalities and      some gentle strengthening and stretching around the right ankle and      foot.  2. We will refill the following medications for her:  Lyrica 75 mg 1      p.o. b.i.d. #60 with 3 refills, morphine sulfate IR 15 mg 1 p.o.      t.i.d. p.r.n. chest wall or sacral pain #90 and Kadian 80 mg 1 p.o.      b.i.d. #60.  No refills.  She also continues to take 400 mg of      ibuprofen q.a.m.   Mr. Abruzzese had expressed desire to talk to me regarding his concern over  Ms. Collums's recent hospitalization.  He states he called the clinic  asking for a consult from myself for his wife in the hospital and was  told that I do not do inpatient consults, which is  true.  He states that  he was not clear that there wee no other physicians in our practice that  did consults and I have tried to clear that up for him today, and in  fact, Dr. Riley Kill stepped in briefly to discuss this further with [name].  Apparently there may have been a miscommunication or  misunderstanding of the communication between our office and Mr. Reznick.   We will see Mariah Grimes back in a month.  She has been stable on the  above medications and is able to maintain a functional lifestyle despite  significant pain complaints due to her metastatic breast cancer, and she  is currently struggling with plantar fasciitis as well at this time.  Recommended she also get a pair of shoes that provides her with less of  a flexible sole and good arch support.  We will see her back in a month.           ______________________________  Brantley Stage, M.D.     DMK/MedQ  D:  05/10/2007 14:26:44  T:  05/10/2007 15:34:16  Job #:  [id number]

## 2010-06-21 NOTE — Assessment & Plan Note (Signed)
Mariah Grimes is a married 65 year old woman who is followed in our Pain and  Rehabilitative Clinic for chronic chest wall pain and sacral pain which  is related to metastatic breast cancer.  She is also followed by Dr.  Lillia Mountain who is her oncologist in [facility].  She has also been  followed by podiatrist who has done multiple procedures over the last  few months on her left great toe.  Mariah Grimes states that she has been  off of her chemotherapy for the last 2 weeks and restarted about 3 days  ago again for another 2-week course.   She brings in today some imaging reports through [facility] at [facility].  A CT of the chest, abdomen, and pelvis with  infusion.  The results are attached to our chart today.  There was no  evidence of recurrence or progressive disease per this CT scan.   The CT scan was dated September 18, 2007.   Mariah Grimes states that her pain was a bit worse after a reduction in her  Kadian from 80 mg to 60 and in fact a week ago she came in for slight  increase in her medicine again.  She is back up to 80 mg b.i.d.  She  reports good relief with this dose.  She also takes 15 mg of immediate  relief up to 3 times per day.   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   Her general activity is moderately affected by her pain.  Pain is  typically worse in the morning and in the evening, worse with activities  in general and improves with rest and medication.  She gets fair-to-good  relief with current meds.   Medications prescribed by our clinic currently include morphine sulfate  IR 15 mg 3 times a day and Kadian 80 mg 1 p.o. b.i.d.   FUNCTIONAL STATUS:  Mariah Grimes is able to walk without assistance.  She  is able to walk at least 20  minutes.  She is able to climb stairs.  She  does not drive.  She is independent with self care, needs some  assistance with higher level household activities.   She admits some numbness and tingling especially in the lower  extremities.  She denies bowel or bladder control problems.  Denies  depression, anxiety, or suicidal ideation.   REVIEW OF SYSTEMS:  Otherwise, negative.   Past medical, social, and family history are unchanged from previous  visit, which was September 26, 2007.   PHYSICAL EXAMINATION:  VITAL SIGNS:  Blood pressure is 131/76, pulse 79,  respirations 18, and 97% saturated on room air.  GENERAL:  She is a well-developed, well-nourished woman who does not  appear in any distress.  She is oriented x3.  Speech is clear.  Affect  is bright.  She is alert, cooperative, and pleasant.  She follows  commands without difficulty.   Cranial nerves and coordination are grossly intact.  Reflexes are  diminished throughout upper and lower extremities.  Sensation is  diminished below the knee in both lower extremities.  Motor strength is  good in both upper and lower extremities without focal deficits.   Romberg test is performed adequately.   Musculoskeletal  exam reveals diminished range of motion in the cervical  spine.  She has approximately 60 degrees of rotation to the right, about  30 degrees to the left.  She lacks full abduction on the right shoulder.  She is able to adduct to approximately 90 degrees on the right, about  110 on the left.  Limitations and internal rotation are noted  bilaterally.   Further evaluation of the left great toe reveals some erythema along the  medial aspect of the nail bed.  She has a healing area over this area.  She has significant tenderness as well.   IMPRESSION:  1. History of breast cancer diagnosed 1997 with metastasis of the      sacrum, liver, and anterior ribs, currently under the care of Dr.      Lillia Mountain and is currently on a  2-week cycle of Xeloda.  2. Paresthesias and peripheral neuropathy on hand and feet with      dysesthetic pain especially in the feet.  3. Chest wall pain secondary to prosthetic chest wall as well as      metastasis.  4. History of right shoulder disfunction which is multifactorial in      nature.  5. Status post radiation to the right sacrum with history of      pathologic fracture.  6. Intermittent constipation which is currently not a problem at this      time.  7. Left great toe area of inflammation.   PLAN:  Recommend follow up with Dr. Lestine Box for further evaluation of  the right toe.  She also suggested x-ray today.  She would like to defer  this until she follows up with Dr. Lestine Box.  She has been following up  with podiatrist who has done intermittent procedures on this area.  Given her history of peripheral neuropathy and chemotherapy,  immunocompromised status, I have concerns about this great toe and she  would like to have it further evaluated as well.   We will refill her Kadian 80 mg 1 p.o. b.i.d. #60.  We will refill her  morphine sulfate IR 15 mg 1 p.o. t.i.d. p.r.n. chest wall pain or sacral  pain, #45.  We will see her back in 1 month.  Mariah Grimes takes her pain  medications as prescribed.  She does not exhibit any aberrant behavior  with the use.  She is able to maintain a relatively functional  lifestyle, despite having metastatic breast cancer and chronic pain  secondary to that.  We will see her back in 1 month.           ______________________________  Brantley Stage, M.D.     DMK/MedQ  D:  11/01/2007 12:43:18  T:  11/02/2007 03:23:17  Job #:  [id number]   cc:   Leonides Grills, M.D.  Fax: [phone]

## 2010-06-21 NOTE — Assessment & Plan Note (Signed)
Mariah Grimes is a 65 year old married woman, who is being followed in our  pain and rehabilitative clinic for chest wall pain related to her  metastatic breast cancer.  She is back in today for refill of her pain  medications.  She was last seen on Jun 07, 2007.  In the interim, she  states she has had a case of bronchitis and was placed on some  antibiotics and an inhaler.  She is also started back on the Xeloda  chemotherapy for her breast cancer.   With respect to her pain, her average pain is between a 3 and a 4 on a  scale of 10, doing overall well with respect to her chest wall pain.  She states earlier in the month, it was bothering her a bit more when  she was coughing from the bronchitis.   Her sleep is fair.   Pain is typically worse with activities, improves with rest and  medications.  She gets fair relief with current meds that are prescribed  from this clinic.   MEDICATIONS FROM THIS CLINIC INCLUDE:  1. Immediate release morphine sulfate 15 mg up to three times a day on      a p.r.n. basis.  2. Lyrica 75 mg one p.o. b.i.d.  3. Kadian 80 mg one p.o. b.i.d., as well.  4. She also takes p.r.n. ibuprofen.  However, since she has restarted      Xeloda, she has stopped.   ACTIVITY LEVEL:  She is able to walk 20 minutes at a time.  She is able  to climb stairs.  She is currently not driving.  She is independent with  self-care and needs some assistance with higher-level activities, such  as shopping and household duties.   She has numbness in the hands and feet.  Denies depression, anxiety.  Denies suicidal ideation.  Denies problems controlling bowel or bladder.  Denies any kind of spinal pain.   SOCIAL/FAMILY HISTORY:  Are otherwise unchanged.   She states that she is not having any further problems with  constipation.  She takes four stool softeners a day and occasionally  takes Senokot and MiraLax and she monitors her bowel movements to insure  that she does not  develop constipation or obstruction again.   EXAM TODAY:  Blood pressure is 119/63, pulse 82, respirations 18, 98%  saturated on room air.   She is a well-developed, well-nourished female who does not appear in  any distress.  She is oriented times three.  Her speech is clear.  Her  affect is bright.  She is alert, cooperative and pleasant.  She follows  commands easily.   Transitioning from sitting to standing is done quickly and without any  pain behaviors.  Her gait in the room is nonantalgic, essentially normal  gait pattern is noted.  She has difficulty with tandem gait.  Her  Romberg test, however, is performed adequately.  She has limitations in  shoulder range of motion, especially on the right.  She is able to  abduct only to about 90 degrees.  On the left it is about between 110  and 120 degrees.  Her reflexes are intact in the upper extremities at 2+  and symmetric.  At the knees and patellar tendons she has diminished  reflexes.  No abnormal tone is noted.  No clonus is noted.  She has no  spinal tenderness with palpation from cervical to lumbar spine.   Her motor strength in the upper and  lower extremities is 5/5 without any  focal deficits.  Decreased sensation is noted below the knees.   IMPRESSION:  1. Resolution of plantar fasciitis on the right.  2. Breast cancer diagnosed in 1997 with metastases to the sacrum,      liver, anterior ribs, currently under the care of Dr. Virginia Rochester,      undergoing chemotherapy, again on Xeloda currently.  3. Paresthesias, hands and feet, specifically worse when on      chemotherapy.  Improves after she has been off therapy for a while.  4. Chronic chest wall pain, secondary to prosthetic chest wall, as      well as metastasis.  Pain is typically worse with activity.  5. Decreased shoulder range of motion, which is multifactorial in      nature.  6. Status post radiation of the right sacrum with history of      pathologic fracture.   7. Intermittent constipation, which is currently not a problem.   PLAN:  We will refill her Kadian 80 mg one p.o. b.i.d. #60.  We will  also refill morphine sulfate IR 15 mg one p.o. t.i.d. p.r.n. chest wall  pain #90.   Ms. Arambula takes her medications as prescribed.  She does not exhibit any  aberrant behavior with her use.  Her pill counts are appropriate.  She  is currently not experiencing any problems with over-sedation or  constipation.  I will see her back in a month.           ______________________________  Brantley Stage, M.D.     DMK/MedQ  D:  07/05/2007 11:44:07  T:  07/05/2007 12:05:17  Job #:  [id number]   cc:   Dr. Virginia Rochester, [facility]

## 2010-06-21 NOTE — Assessment & Plan Note (Signed)
Mariah Grimes is a 65 year old woman who is followed in our [facility] for chest wall pain and sacral pain related to  metastatic breast cancer.  She is also followed by Dr. Lillia Mountain who is her  oncologist in [facility].  Mariah Grimes states that she has restarted  her Xeloda chemotherapy again 2 days ago and 2-week course is currently  planned for her.   She also reports that she has had a great toe which a podiatrist had  done some surgery on about a month ago and is currently still healing.   She continues to follow up with Dr. Tally Joe who manages her  hypertension.   Mariah Grimes is back in today and reports, overall, she has been well.  She  saw Dr. Lillia Mountain few days ago.  With respect to her pain, she continues to  have predominantly right chest wall pain as well as some sacral pain  which she described as about 3 or 4 as average on a scale of 10.  Currently, in the office she states she has about 2.  Pain tends to be  fairly constant, however, which improves for her at night.   Her activity is moderately affected.  Pain is worse with walking,  bending, and standing.  Improves with rest and medications.  She gets  fairly good relief currently with medications which are provided through  this clinic.   MEDICATIONS:  Medications from this clinic include the following:  1. Lyrica 75 mg twice a day.  2. Kadian 80 mg b.i.d.  3. Morphine sulfate immediate release 15 mg 3 times a day.   FUNCTIONAL STATUS:  She is walking about 20 minutes.  At a time, she is  able to climb stairs.  She is not driving.  She is independent with self  care.   REVIEW OF SYSTEMS:  Positive for numb feet which she has had since she  started chemotherapy.  She also gets some brakedown of the skin on the  feet and hands secondary to chemotherapy as well.   Past medical, social, family history are otherwise unchanged.   PHYSICAL EXAMINATION:  VITAL SIGNS:  Blood pressure is 158/67, pulse  85,  respirations 18, 98% saturation on room air.  GENERAL:  She is well-developed, well-nourished female who does not  appear in any distress.  She is oriented x3.  Speech is clear.  Affect  is bright.  She is alert, cooperative, and pleasant.  Follows commands  without difficulty.  NEUROLOGIC:  Cranial nerves are grossly intact.  Coordination is intact.  Reflexes are 1+ in the upper extremities, diminished in the lower  extremities.  No abnormal tone is noted, no clonus is noted.  She has  decreased sensation below the knees with respect to light touch.  Motor  strength, however, is quite good.   She lacks full range of motion in the right shoulder.  Her gait is  otherwise stable.  Tandem gait.  She has a little trouble with Romberg  test.  She is able to perform adequately.   Minimal tenderness is noted in the right anterior chest wall today with  palpation.   IMPRESSION:  1. History of breast cancer diagnosed in 1997, with metastasis to the      sacrum, liver, and anterior ribs and is currently under the care of      Dr. Lillia Mountain.  She currently started a 2-week cycle of Xeloda.  2. Paresthesias in the hands and  feet.  3. 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  4. Decreased range of motion in the right shoulder which is      multifactorial in nature.  5. Status post radiation to the right sacrum with the history of      pathologic fracture.  6. Intermittent constipation, currently controlled with stool      softeners, not a problem at this time.   PLAN:  We will reduce her Kadian today to 60 mg twice a day.  We will  give her 1 extra dose of her immediate release morphine bringing her to  15 mg up to 4 times a day.  She has been stable on the above  medications.  She also takes ibuprofen 200 mg tablets in the morning,  but has not noted any problems with abdominal pain from this.  She  continues to take her medications as  prescribed, not exhibited any  aberrant behavior.  I will see her back in 4 weeks.           ______________________________  Brantley Stage, M.D.     DMK/MedQ  D:  10/04/2007 12:57:19  T:  10/05/2007 04:39:14  Job #:  [id number]   cc:   Tally Joe, M.D.  Fax: [phone]   [facility] [place]  [place] [address]

## 2010-06-21 NOTE — Assessment & Plan Note (Signed)
[name] is a 65 year old married woman who is followed in our  [facility] for chronic pain complaints which are  related to her metastatic breast cancer.  She has chronic right-sided  chest wall pain, sacral pain.  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  Lillia Mountain over in [place] for her cancer.   She is back in today for refill of her pain medications.  She uses  Kadian 80 mg twice a day and morphine sulfate 15 mg t.i.d. on a p.r.n.  basis as well as Lyrica 75 mg twice a day.   With respect to her pain, it has been variable over the last month.  She  has developed some right lower extremity pain which had an onset around  the time she had been started in some physical therapy.  She had done  some gentle straight leg raises and abduction and adduction exercises of  her legs.  She attended 1 visit for an evaluation and then was given  some gentle exercises.  She was seen one more time in physical therapy  and discontinued due to pain in her right lower extremity.  She has seen  orthopedist, Dr. Shelle Iron saw her yesterday and felt she had a peroneal  tendonitis.  Per her verbal account, I do not have notes from his office  to confirm this.  Apparently, she had had some lower extremity x-rays  around the ankle which were normal and apparently some hip x-rays were  recently done by Dr. Shelle Iron, results I do not have.   Pain is typically localized in the right anterior chest wall apparently  in the right side.  She also has some right upper hip pain radiating to  the right lateral hip and into the foot.   Pain is typically worse with walking, improves with rest and medication.   FUNCTIONAL STATUS:  She can walk 5-10 minutes at a time.  She has  difficulty with stairs.  She does not drive.  She is independent with  self-care.  Denies problems controlling bowel or bladder.  Denies  depression,  anxiety, or suicidal ideation.   REVIEW OF SYSTEMS:  Otherwise, negative.   No other changes in past medical, social, or family history.   Exam, blood pressure is 171/81, pulse 90, respirations 18, 92% saturated  on room air.  She is well-developed, well-nourished female, who does not  appear in any distress.  She is oriented x3.  Speech is clear.  Affect  is bright.  She is alert, cooperative, and pleasant.  Follows commands  without difficulty and answers my questions appropriately.   Cranial nerves and coordination are intact.  Reflexes are diminished.  Overall, no abnormal tone is noted.  No clonus is noted.  No tremors are  appreciated.  She has decreased sensation along the bilateral lower  extremities, difficulty discerning pinprick from light touch, seems a  bit more numb in the right lateral foot, however.  Motor strength  overall difficult to assess secondary to pain.  Hip flexion is in the 4-  /5 range bilaterally, knee extension is 5/5, dorsiflexion appears to be  5/5.  She complains of pain with plantar flexion at least 4/5 with  strength.   Transitioning to sitting to standing is done slowly.  Her gait in the  room is antalgic with decreased push-offs in the right lower extremity  during the gait cycle.  IMPRESSION:  1. Metastatic breast cancer.  2. 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  3. History of chest wall pain secondary to prosthetic chest wall as      well as metastases.  4. History of right shoulder dysfunction.  5. Status post radiation to the right sacrum with history of      pathologic fractures.  6. Intermittent constipation, currently not positive at this time.  7. New right lower extremity pain.   PLAN:  Concerned here regarding history of pathologic fracture.  I am  wondering if there is a recurrence in her right sacral ala.  We will  refill her pain medications for her  today including Lyrica 75 mg 1 p.o.  b.i.d. #60 with a refill, morphine sulfate 15 mg 1 p.o. t.i.d. #90,  Kadian 80 mg 1 p.o. b.i.d. #60, no refills, and morphine sulfate.  We  will discuss case further with her cancer doctor, Dr. Lillia Mountain and send a  copy of my note over to him as well.           ______________________________  Brantley Stage, M.D.     DMK/MedQ  D:  08/05/2008 13:41:07  T:  08/06/2008 03:12:52  Job #:  [id number]   cc:   Dr. Lillia Mountain

## 2010-06-21 NOTE — Assessment & Plan Note (Signed)
Mariah Grimes is a 65 year old married female who is being seen in our [facility]  and [facility] for multiple pain complaints related to  metastatic breast cancer.   She is back in today for a refill of her medications.  She was last seen  by me on 11/21/06.  She states her average pain is about a 3 or 4 on a  scale of 10, described as dull, constant.  She does have some tingling  in the distal extremities.  He pain is located across the anterior chest  wall, as well as in the right pelvic area.   The pain is worse in her pelvic area with sitting.  She gets fair to  good relief with the current meds that she is being treated with.   She is able to walk about 15 minutes at a time.  She is able to climb  stairs and drive.   She is independent with her self care.  She does help out with meal  preparation and defers heavier household duties and shopping to family  members.   She does report some numbness and tingling in her fingers and her feet.  She does have some fragile skin along the left lateral ankle area.   PAST MEDICAL HISTORY, SOCIAL HISTORY, FAMILY HISTORY:  Significant for  continued chemotherapy.  She is currently taking Xeloda and within the  last month has had more symptoms in her distal fingers and toes.  Denies  problems with over sedation or constipation at this point.   MEDICATIONS:  Provided by our clinic include morphine sulfate immediate  release one p.o. t.i.d., Kadian 80 mg twice a day, p.r.n. Lidoderm and  Lyrica 75 mg twice a day.   PHYSICAL EXAMINATION:  VITAL SIGNS:  Blood pressure is 104/65, pulse 79,  respirations 18, 99% saturation on room air.  GENERAL:  She is a well-developed, well-nourished female who does not  appear in any distress.  She is oriented x3.  Her speech is clear.  Her  affect is bright, alert.  She is cooperative, pleasant, and follows  commands without difficulty.   Transitioning from sit to stand is done with ease.  Her gait in  the room  is stable.  She has a little difficulty with tandem gait.  Romberg's  test is performed adequately.   She has some limits in her shoulder range of motion bilaterally.  Her  cervical and lumbar range of motion are thoroughly well retained.  Her  reflexes are diminished in the upper and lower extremities.  Decreased  sensation distally in hands and feet, but intact.  No abnormal tone is  noted.  There is no clonus appreciated.  Her motor strength is good in  both upper and lower extremities without focal weakness.   SKIN: Noted in her hands and feet erythema with some thinning of the  skin in the left lateral ankle area.  Decreased sensitivity at the  fingertips and toes.   IMPRESSION:  1. Breast cancer, diagnosed 1997 with metastases to the sacrum, liver,      anterior ribs, currently under the care of Dr. Virginia Rochester, currently      undergoing chemotherapy Xeloda.  2. Paresthesias and erythema of bilateral hands and feet.  It is worse      over the last month.  3. Chronic chest wall pain secondary to prosthetic placement, as well      as metastases.  4. Decreased shoulder range of motion, multifactorial in nature.  5. Status post radiation to the right sacral region with history of      pathologic fracture.  6. History of coccydynia, overall improved at this visit.   PLAN:  We will refill the following medications for her:  Lyrica 75 mg  one p.o. b.i.d., number 60, 3 refills; Kadian 80 mg one p.o. b.i.d.,  number 60, no refills; morphine sulfate IR 15 mg one p.o. t.i.d. p.r.n.  chest wall pain or pelvis pain, number 90, no refills.  She also takes  ibuprofen 400 mg twice a day.  She has dramatically diminished her  acetaminophen intake down to a 500 mg tablet and not more than once a  week or so.  Overall, with the above medications, she reports fair to  good relief of her pain.  She takes her medications as directed.  She  does not have any complaints of constipation or over  sedation with their  use.  Will see her back in a month.           ______________________________  Mariah Grimes, M.D.     DMK/MedQ  D:  12/19/2006 11:19:37  T:  12/20/2006 06:54:06  Job #:  [id number]

## 2010-06-21 NOTE — Discharge Summary (Signed)
NAME:  Mariah Grimes, Mariah Grimes                ACCOUNT NO.:  [id number]   MEDICAL RECORD NO.:  [id number]          PATIENT TYPE:  INP   LOCATION:  1521                         FACILITY:  [facility]   PHYSICIAN:  Kela Millin, M.D.DATE OF BIRTH:  [birth year]   DATE OF ADMISSION:  04/02/2007  DATE OF DISCHARGE:  04/05/2007                               DISCHARGE SUMMARY   DISCHARGE DIAGNOSIS:  1. Partial small bowel obstruction.  2. Hypertension.  3. Hypokalemia - resolved.  4. History of metastatic breast cancer.  5. History of asthma.   PROCEDURES AND STUDIES:  CT scan of the abdomen and pelvis - high-grade,  likely complete small bowel obstruction suspect transition point in  right lower quadrant based on decompressed ileum.  Stable intra and  extrahepatic biliary duct dilatation.  Distal esophageal thickening,  question gastroesophageal reflux, consider endoscopic correlation in the  appropriate clinical settings.  A 1.5 cm posterior right hepatic lobe  heterogeneous lesion.  Right sacral ala fracture in patient with known  metastatic lesion., age indeterminate.   CONSULTATIONS:  Surgery - Mariah Grimes.   BRIEF HISTORY:  The patient is a 65 year old, white female with the  above listed medical problems who presented with abdominal distention  and pain.  She initially saw her primary care physician who ordered an  abdominal series and it was consistent with a bowel obstruction.  In the  ER, she had a CT scan of the abdomen done and the results as stated  above.  An NG tube was placed for decompression in the ER. She was  admitted for further evaluation and management.   HOSPITAL COURSE:  1. Partial small bowel obstruction - upon admission a CT scan was done      and the results as stated above, an NG tube was placed and [facility] consulted.  Mariah Grimes saw the patient and recommended non      operative management.  By the next hospital day, the patient      reported three to  four loose bowel movements and the assessment per      Mariah Grimes was that this was a partial small bowel obstruction and      it was resolving.  At this point,  surgery ordered to clamp the NG      intermittently and repeat abdominal x-ray showed improving small      bowel obstruction with increasing colonic stool and gas suggesting      no complete obstruction present.  The patient was kept n.p.o. and      hydrated with IV fluids and her pain managed with IV narcotics.      Subsequently the patient indicated her desire to be transferred to      [facility] as her oncologist, Mariah Grimes, was there.  I      discussed the patient with Mariah Grimes partner and he accepted the      patient and so she was transferred to [facility] with the NG tube in      place and IV fluids for continued management.  2. Hypokalemia - her potassium was replaced during her hospital stay.  3. Hypertension - her blood pressures were monitored and managed with      IV anti hypertensive as needed.  4. History of asthma - remained stable throughout her hospitalization.  5. History of metastatic breast cancer - per Mariah Grimes as above.   MEDICATIONS AT TIME OF TRANSFER:  1. Protonix 40 mg IV daily.  2. Zofran 4 mg IV q.6 h p.r.n.  3. Morphine 2-4 mg IV q.2 h p.r.n.  4. Vasotec 1.25 mg IV q.4 h p.r.n.  5. Chloraseptic spray p.r.n.  6. Lorazepam 0.5 mg IV q.8 h p.r.n.   CONDITION AT THE TIME OF TRANSFER:  Improved/Stable.   FOLLOW-UP CARE:  The patient was transferred to Mariah Grimes at [facility] and outpatient follow-up to be indicated at the time of discharge  from [facility].      Kela Millin, M.D.  Electronically Signed     ACV/MEDQ  D:  04/25/2007  T:  04/25/2007  Job:  [id number]   cc:   Tally Joe, M.D.  Fax: [phone]   Peggyann Juba, MD  [facility]

## 2010-06-21 NOTE — Assessment & Plan Note (Signed)
Mariah Grimes is a 65 year old married woman who is being seen in our Pain &  [facility]itative Clinic for multiple chronic pain complaints, which are  mostly related to metastatic breast cancer.  She has chronic chest wall  pain, as well as intermittent sacral pain.  Her average pain is about a  3-4/10 described as dull and constant.  She states she has done a bit  more work with her upper extremities over the last week and has  increased chest wall soreness.  The pain is typically worse in the  evening, improves with rest and medication, is worse with bending,  standing, upper extremity activity.  She reports fairly good relief with  the current medications prescribed by our clinic.   MEDICATIONS:  From this clinic include:  1. Morphine sulfate immediate release 15 mg 1 p.o. t.i.d. p.r.n.  2. Kadian 80 mg 1 p.o. b.i.d.  3. Lyrica 75 mg b.i.d.   She is not reporting any problems with these medications.  She takes  them as prescribed.  She is stating she has had a little bit more  constipation recently.  She is using over-the-counter Senokot and  Colace.  She is a high functioning individual, independent with self-  care, needs some assistance with high level household activities.  She  is able to walk at least 20 minutes, which is an improvement from last  month.  She is able to climb stairs.  She currently is not driving.  She  denies any new medical problems since her last visit.  She has seen a  podiatrist for some foot care.  After the chemotherapy, recently, she  has had some scaling of the skin on her feet, as well as nail overgrowth  and this was taken care of recently.   SOCIAL AND FAMILY HISTORY:  Otherwise, unchanged as well.   PHYSICAL EXAMINATION:  VITAL SIGNS:  Blood pressure 145/68, pulse 75,  respirations 18, 98% saturation on room air.  GENERAL:  Well-developed, well-nourished female who does not appear in  any distress.  She is oriented x3.  Her speech is clear, her affect  is  bright.  She is alert, cooperative and pleasant, she follows commands  without difficulty.  She has limitations in cervical range of motion approximately lacking 20  degrees with rotation to the left.  Also some limitations noted in  forward flexion and extensions minimally.  She lacks full shoulder range  of motion, especially on the right.  She is able to abduct to  approximately 90 degrees.  Left, she is able to get to about 110.  She  has good strength in both upper and lower extremities without any  abnormalities.  She has good strength in both upper and lower  extremities without focal weakness.  Reflexes are diminished in the  lower extremities.  Normal tone is noted.  No clonus is noted.  No  tenderness is noted in the cervical, thoracic or lumbar spine.  She has  minimal tenderness in the sacral region.  Evaluation of skin and her  hands and feet reveal erythema in both hands and shininess of the  fingers.  She does have thickened toenails and lots of calluses in the  heel and forefoot.  There are no open skin areas noted today.  She has  normal gait.  Tandem gait, Romberg tests are performed adequately as  well.   IMPRESSION:  1. Breast cancer diagnosed 1997 with metastases to sacrum, liver,  anterior ribs.  Currently under the care of Dr. Virginia Rochester, undergoing      chemotherapy, Xeloda.  2. Paresthesias and erythema bilateral hands and feet over the last      couple of months, attributed to chemotherapy.  3. 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  Worse with activity.  4. Decreased shoulder range of motion, which is multi-factorial in      nature.  5. Status post radiation to the right sacral region with a history of      pathologic fracture.  6. History of toxidermia.  This is not a major problem for her at this      time.  7. Constipation.   PLAN:  Discussed bowel program with her.  Will add a stimulant in   addition to her Colace today.  She will trial on Dulcolax tablets 1-2  p.o. at bedtime every other day.  We also discussed the possibility of  decreasing her Kadian dose from 80 mg b.i.d. down to 60 mg b.i.d. with a  slight increase in her morphine IR from 15 t.i.d. to q.i.d.  She is  reluctant to pursue this currently.  She will give it some thought and  we will discuss it in the upcoming visits.  She has been quite stable on  these medications, is able to maintain her function, despite multiple  pain complaints.           ______________________________  Brantley Stage, M.D.     DMK/MedQ  D:  02/20/2007 12:57:58  T:  02/20/2007 13:58:50  Job #:  [id number]

## 2010-06-21 NOTE — Consult Note (Signed)
NAME:  Mariah Grimes, Mariah Grimes                ACCOUNT NO.:  [id number]   MEDICAL RECORD NO.:  [id number]          PATIENT TYPE:  INP   LOCATION:  1521                         FACILITY:  [facility]   PHYSICIAN:  Angelia Mould. Derrell Lolling, M.D.DATE OF BIRTH:  Jun 19, 1945   DATE OF CONSULTATION:  04/02/2007  DATE OF DISCHARGE:                                 CONSULTATION   REASON FOR CONSULTATION:  Evaluate small bowel obstruction.   HISTORY OF PRESENT ILLNESS:  This is a 65 year old white female with a  significant past history of metastatic breast cancer with known mets to  the sacrum as well as the sternum. She has had a sternal resection for  metastatic disease.  She also has a history of ovarian cancer and has  had abdominal hysterectomy, pelvic lymphadenectomy and presumed  omentectomy by Dr. Stanford Breed in 2003.   She has been on Avastin every 2 weeks and is followed by Dr. Marikay Alar  Magrinat.   With that past history, she comes to the emergency room tonight  complaining of a 4-6-day history of abdominal pain.  This is somewhat  diffuse and not localizing in nature.  She has not had any real similar  symptoms in the past.  Denies any history of bowel obstruction in the  past.  Her last bowel movement was 3 days ago.  She has been passing a  little bit of flatus.  She said she vomited some over the weekend. She  was evaluated by the emergency department staff and by Dr. Rito Ehrlich of  the [facility] hospitalists. A nasogastric tube was placed which is  uncomfortable but her abdomen her abdomen is not hurting now. I was  asked to consult.   PAST HISTORY:  1. Breast cancer.  Status post partial mastectomy and lymph node      dissection by Dr. Paulla Fore in 1997. She is known to have      metastasis to her right sacrum.  Apparently she had metastasis to      her sternum and clavicle and had a partial resection of this by Dr.      Edwyna Shell in 2005.  2. Ovarian cancer status post hysterectomy, pelvic  lymphadenectomy and      presumed omentectomy by Dr. Arlyce Dice and Dr. Stanford Breed in 2003.  3. Asthma.  4. Hypertension.   CURRENT MEDICATIONS:  1. Avastin IV every 2 weeks.  2. Avapro oral.  3. Calcium oral.  4. Centrum Silver oral.  5. Docusate oral.  6. Ibuprofen.  7. Kadian.  8. Lorazepam.  9. Lyrica.  10.Morphine oral.  11.Zometa IV.   DRUG ALLERGIES:  None known.   It should be mentioned that she has a Port-A-Cath on the left side that  goes up into the left internal jugular vein.   SOCIAL HISTORY:  She is married, lives in [place] and has two  children.  She is disabled.  Denies tobacco or alcohol.   FAMILY HISTORY:  Mother deceased at age 75, had breast cancer but was  not the cause of death. The patient states that she had a  osteoporosis.  Father  died of myocardial infarction and also had  prostate cancer.   REVIEW OF SYSTEMS:  All systems reviewed and noncontributory except as  described above.   PHYSICAL EXAMINATION:  A pleasant woman who appears younger than her  stated age.  Temperature 98.5, blood pressure 149/54, heart rate 90, respiratory rate  20.  HEENT:  EYES, Sclerae clear.  Extraocular movements intact. Ears, mouth,  throat, nose, lips, tongue and oropharynx are without gross lesions.  NECK:  Supple, nontender with no adenopathy or mass.  I do feel a port  catheter going up over the left clavicle and then entering the left neck  presumably in the left internal jugular vein  CHEST:  Lungs are clear.  No chest wall tenderness.  There is a well-  healed sternotomy type scar with minor deformity of the sternum from  previous resection.  HEART:  Regular rate and rhythm.  No murmur.  Radial and femoral pulses  are palpable.  ABDOMEN:  Soft and not objectively tender. It is distended.  There is a  long midline scar.  There is no incisional hernia. I do not feel any  inguinal or femoral hernias.  Liver and spleen are not enlarged.  Certainly no  percussion tenderness and no rebound tenderness.  No real  peritoneal signs.  EXTREMITIES:  She moves all 4 extremities well without pain or  deformity.  NEUROLOGIC:  No gross motor sensory deficits.   ADMISSION DATA:  Abdominal x-rays suggest a small-bowel obstruction  somewhat distal. CT scan is read as showing high-grade distal small  bowel obstruction.  There is a 1.5 cm mass in the right lobe of the  liver posteriorly which is new. She has an old granuloma in her spleen.  Clips are present in the pelvis bilaterally from previous  lymphadenectomy. Some esophageal thickening is noted which I think is  chronic.  A right sacral fracture is noted again from her known  metastatic disease. There is no ascites or bulky mass in the abdomen.   LABORATORY DATA:  Lab work shows a hemoglobin of 13.8, white blood cell  count of 5400, normal  differential.  Sodium 132, potassium 3.4 chloride  86, BUN 18, creatinine 0.7, lipase 16.  Urinalysis is mostly  unremarkable, very small leukocyte esterase.   ASSESSMENT:  1. Small bowel obstruction. This appears to be radiographically high-      grade and somewhat distal in the small bowel.  The etiology is not      clear but certainly adhesions or tumor or both could be causing      this. There is no sign of any compromised bowel at this time.  2. Breast cancer status post lumpectomy.  Known metastatic disease to      the sternum and sacrum, currently taking Avastin therapy.  3. Ovarian cancer status post hysterectomy, pelvic lymphadenectomy and      presumed omentectomy.  4. Status post sternal resection by Dr. Dewayne Shorter for metastatic      disease.  5. Hypertension.  6. Asthma.   PLAN:  1. The patient is admitted by the [facility] hospitalist.  2. A nasogastric tube has been placed and appears to be draining well.  3. Initially, I think that we should recommend nonoperative management      since she is nontender and is on Avastin which will have  adverse      effects on wound healing.   If high grade obstruction persists, she may require a laparotomy.  I have explained all of this to the patient.      Angelia Mould. Derrell Lolling, M.D.  Electronically Signed     HMI/MEDQ  D:  04/02/2007  T:  04/02/2007  Job:  [id number]   cc:   Hollice Espy, M.D.   Valentino Hue. Magrinat, M.D.  Fax: [phone]

## 2010-06-21 NOTE — Assessment & Plan Note (Signed)
Mariah Grimes is a 65 year old married woman who is being seen in our pain  and rehabilitative clinic for multiple pain complaints related to  metastatic breast cancer.   She is back in today for a refill of her medications.  Over the last  month, she has had worsening of her hand and foot pain.  She states that  her feet have really been bothering her.  She has noticed some  thickening of the nails at the end, and it bothers her to wear her  shoes, currently.   Average pain is about a 3 on a scale of 10.  Moderately interfering with  activity and enjoyment of life.  Chest wall pain has not been as much of  a problem for her, nor is the sacral pain or low back pain, biggest  problem, currently, is her feet.   Pain is worse with walking, bending, standing.  Improves with rest.  Gets fair relief with current meds that she is on.   She has no drug allergies.   Medications provided by our clinic include morphine sulfate immediate  release 15 mg 1 p.o. t.i.d., and Kadian 80 mg 1 p.o. b.i.d., Lyrica 75  mg twice a day.   She is able to walk about 15 minutes at a time.  She is able to climb  stairs and drive.  She is independent with her self care, needs some  assistance with higher level activities.   Reports some numbness and tingling into the hands and feet.   No other changes in her past medical, social, or family history.  Has  had some urinary retention.  She will follow up with Dr. Virginia Rochester for  these symptoms.   PHYSICAL EXAMINATION:  Her blood pressure is 124/74, pulse 82,  respirations 18, 98% saturated on room air.  She is a well-developed, well-nourished female who does not appear in  any distress this morning.  Her affect is bright.  She is alert,  cooperative and pleasant.  She is oriented x3.  Speech is clear, follows  commands without difficulty.  Transitioning from sit to stand is done with ease.  Gait is stable.  Tandem gait, Romberg's test are performed adequately.  Limitations noted in right shoulder motion to approximately 90 degrees  with abduction.  Cervical range of motion is within near normal limits.  Lumbar motion is adequate as well.  Reflexes are overall diminished in the upper and lower extremities.  There is no abnormal tone, no clonus is noted.  Motor strength is good  in both upper and lower extremities without focal deficit.  Evaluation of her skin reveals erythema in both hands, thinning of the  skin, shininess in the fingers and mild edema.  Feet reveal thickened  toenails and loss of some calloused areas.  She is having some  complaints of some cracking and tenderness.   IMPRESSION:  1. Breast cancer, diagnosed 1997, with metastasis to sacrum, liver,      anterior ribs.  Currently under the care of Dr. Virginia Rochester, undergoing      chemotherapy, Xeloda.  2. Paresthesias and erythema, bilateral hands and feet over the last      couple of months.  3. 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  4. Decreased shoulder range of motion, multifactorial in nature.  5. Status post radiation to the right sacral region with history of      pathologic fracture.  6. History  of coccydynia, currently not a main problem today.   PLAN:  Will refill her medications, Kadian 80 mg 1 p.o. b.i.d. #60,  morphine sulfate IR 15 mg 1 p.o. t.i.d. p.r.n. chest wall pain or sacral  pain #90.  She does not need a refill on her Lyrica today.  She also  continues to take ibuprofen on a p.r.n. basis up to 400 mg twice a day,  and she may take acetaminophen 500 mg tablet once or twice a week.   I would like foot clinic to see her in the upcoming week or two over at  [facility] to evaluate and possibly help her with trimming her nails  which appear to be somewhat thickened at the end, interfering with shoe  wear at this time.  Possibly consider ordering a shoe with a wider toe  box to accommodate her thickened nails and  sensitive feet.  Will see her  back in 5 weeks.           ______________________________  Brantley Stage, M.D.     DMK/MedQ  D:  01/16/2007 11:13:10  T:  01/16/2007 14:09:33  Job #:  [id number]

## 2010-06-21 NOTE — H&P (Signed)
NAME:  Mariah Grimes, Mariah Grimes                ACCOUNT NO.:  [id number]   MEDICAL RECORD NO.:  [id number]          PATIENT TYPE:  INP   LOCATION:  0101                         FACILITY:  [facility]   PHYSICIAN:  Hollice Espy, M.D.DATE OF BIRTH:  1945/11/26   DATE OF ADMISSION:  04/02/2007  DATE OF DISCHARGE:                              HISTORY & PHYSICAL   PRIMARY CARE PHYSICIAN:  Tally Joe, M.D.   GASTROENTEROLOGIST:  Bernette Redbird, M.D.   CONSULTATIONS:  1. [facility].  2. Her oncologist, Dr. Peggyann Juba at [facility].   CHIEF COMPLAINT:  Abdominal distention and pain.   HISTORY OF PRESENT ILLNESS:  The patient is a 65 year old white female  with a past medical history of breast cancer with metastases, who for  the past 5 days has had complaints of progressively worsening abdominal  pain, nausea and vomiting to the point where she became so distended and  could not keep anything down when she came in.  She went to see her PCP,  Dr. Azucena Cecil today who ordered an abdominal series.  The patient was found  to have a near complete small bowel obstruction.  She was sent over to  the emergency room.  In the emergency room, there was a concern there  was a possibility that the patient's metastatic cancer may have led to  this obstruction, so she underwent a CT of the abdomen and pelvis which  noted a separate little metastases and a focal complete small bowel  obstruction.  However, the obstruction was not in any way related to any  type of tumor or metastases.  The patient had an NG tube placed for  decompression.  She is currently complaining of some mild nausea, but  otherwise is doing well.  She denies any headaches, vision changes,  dysphagia.  No chest pain, palpitations, no shortness of breath, wheeze,  cough.  She says her abdominal pain is much better.  She says her  distention is also better as well.  No hematuria, dysuria, constipation,  diarrhea, focal extremity  numbness, weakness or pain.   REVIEW OF SYSTEMS:  Otherwise negative.   PAST MEDICAL HISTORY:  1. Breast cancer.  2. Hypertension.  3. Asthma.   MEDICATIONS:  1. Avapro.  2. Avastatin.  3. Calcium.  4. Centrum Silver.  5. Colace.  6. Motrin.  7. Kadian.  8. Zometa.  9. Morphine.  10.Lyrica.  11.Ativan.   ALLERGIES:  NO KNOWN DRUG ALLERGIES.   SOCIAL HISTORY:  No tobacco, alcohol or drug use.   FAMILY HISTORY:  Noncontributory.   PHYSICAL EXAMINATION:  VITAL SIGNS:  Temperature 97.8, heart rate 103,  blood pressure 153/97, respirations 18, O2 sat 98% on room air.  GENERAL:  She is alert and oriented x 3 in some mild distress secondary  to discomfort from the NG tube.  HEENT:  Normocephalic, atraumatic.  She has an NG tube placed to  suction.  Mucous membranes are dry.  She has no carotid bruits.  HEART:  Regular rate and rhythm, S1 and S2.  LUNGS:  Clear to auscultation bilaterally.  ABDOMEN:  Soft, slightly distended.  Minimal tenderness with absent  bowel sounds.  EXTREMITIES:  No clubbing, cyanosis, or edema.   LABORATORY DATA:  White count 5.4, H&H 13.8 and 42, MCV 94, platelet  count 273 no shift, sodium 132, potassium 3.4, chloride 86, bicarb 35,  BUN 18, creatinine 0.7, glucose 113.  LFTs are unremarkable.  UA noted  small leukocyte esterase, small bilirubin, 0-2 white cells, few  bacteria.  Lipase level is 16.   DIAGNOSTICS:  1. Abdominal x-ray notes high-grade small bowel obstruction, post-      surgical changes of chest reconstruction.  2. CT of the abdomen notes high-grade likely complete small bowel      obstruction, suspect transition point in the right lower quadrant      based on decompressed ilium.  Stable intra-and extra-hepatic      biliary duct with dilatation, distal esophageal thickening,      question GERD.  A 1.5 cm posterior right hepatic lobe lesion.   ASSESSMENT/PLAN:  1. Complete small bowel obstruction.  Make the patient n.p.o., IV       fluids, IV PPI, pain and nausea control.  Surgery will follow as      well.  Repeat films tomorrow.  2. Hypokalemia.  We will replace.  3. History of breast cancer with liver metastases, stable, continue to      monitor.  4. Hypertension, currently holding all p.o. medications.  5. Asthma, stable.      Hollice Espy, M.D.  Electronically Signed     SKK/MEDQ  D:  04/02/2007  T:  04/02/2007  Job:  [id number]   cc:   Peggyann Juba, MD   Tally Joe, M.D.  Fax: [phone]   Bernette Redbird, M.D.  Fax: [phone]

## 2010-06-21 NOTE — Assessment & Plan Note (Signed)
[name] is a 65 year old married female who is being seen in our pain  and rehabilitative clinic today for refill of her medications.   She has a history of multiple pain complaints mostly related to her  metastatic breast cancer.   She was last seen by me on August 29, 2006.  She is back in today for a  refill of her medications.   She continues to stay in close contact with Dr. Jonette Pesa at [facility] for managing her chemotherapy.   Apparently she has had some scans done, I do not have the results.   She states that her average pain is about a 4 on a scale of 10.  Her  pain moderately interferes with activity and enjoyment of life.   Pain is typically located in the anterior chest wall and somewhat in the  sacral area and she has had some problems with numbness and tingling in  the feet and the hands, however this has improved over the last couple  of weeks since she has been taken off of Taxol.   She is on a new chemotherapy drug, Xeloda, which does not cause as much  peripheral pain and numbness for her.  She sleeps fair, pain is  typically worse toward the end of the day.  She gets fair relief with  the medications that she is prescribed from this clinic.   PAIN MEDICATIONS:  1. Morphine sulfate 15 mg immediate relief 1 p.o. t.i.d. p.r.n.  2. Kadian 80 mg one p.o. b.i.d.  3. Lidoderm p.r.n.  4. Lyrica 75 mg one p.o. b.i.d.  5. Over the counter she takes Tylenol 500 mg up to 4 times a day which      has been decreased to not more than 1000 mg per day now.  6. Ibuprofen 400 mg twice a day.   FUNCTIONAL STATUS:  She is able to walk about 20 minutes at a time, she  is able to climb stairs, she does not drive.  She is independent with  her self care.   REVIEW OF SYSTEMS:  Remarkable for intermittent urinary retention and  constipation.   Past medical, social, family history otherwise unchanged other than  medication changes noted and history of present illness.   EXAMINATION:  Blood pressure 150/86, pulse 86, respirations 16, 99%  saturated on room air.  She is a well-developed, well-nourished female  who does not appear in any distress.  She is oriented x3, her speech is  clear, affect is bright and alert, she is cooperative and pleasant, she  follows commands without difficulty.  Has multiple questions about her  medications today.   She transitions from sitting to standing without difficulty.  Her tandem  gait and Romberg's test are performed adequately.  Limitations are noted  in shoulder range of motion on the right.  She is able to abduct to  about 110 degrees today.  She has some mild limitations on the left with  abduction.  Her reflexes are diminished in the lower extremities.  Her  motor strength is good in the lower extremities without focal deficit.  Pinprick is intact as well as light touch below the knees in both lower  extremities, no open areas are noted in the feet.   IMPRESSION:  1. Breast cancer diagnosed 1997 with metastases to sacrum and liver      and anterior ribs.  She is under care of Dr. Jonette Pesa over at      [facility],  [place].  2. 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  3. Decreased range of motion right shoulder, multifactorial in nature.  4. Status post radiation to the right sacral region.  5. History of coccydynia.  6. Overall improvement in bilateral lower extremity paresthesias and      dysesthesias since discontinuation of Taxol.   PLAN:  Will refill the following medications for her:  Morphine sulfate  IR one tablet p.o. t.i.d. 15 mg for chest wall pain #90 and Kadian 80 mg  one p.o. b.i.d. #60, no refills on either drug.  Tylenol was decreased  from 2000 mg to 1000 mg, she takes this over the counter and she takes  ibuprofen 400 mg twice a day as well.  She has been taking her  medication as directed, she does not display any aberrant behavior and  is  getting fair relief with the medications she is on currently.           ______________________________  Brantley Stage, M.D.     DMK/MedQ  D:  10/24/2006 09:56:41  T:  10/24/2006 12:44:30  Job #:  [id number]   cc:   Dr. Jonette Pesa

## 2010-06-21 NOTE — Assessment & Plan Note (Signed)
Mariah Grimes is a 65 year old married female who is being followed in our  pain and rehabilitative clinic for multiple pain complaints, which are  related to metastatic breast cancer.   She was last seen by me on Jul 05, 2006.  She is back in today for a  refill on her medications.   She states she has been placed on 2 new injections for low white and red  cells.  She has been started on Aranesp as well as Neulasta.   She states she has had some generalized fatigue recently.  She also is  complaining of an achiness in her chest wall, which is not new.   She does state that she has been having some burning in her feet, which  has been bothersome for her over the last month, typically her feet burn  and aggravate her in the evening and at night.   She states her average pain, however, overall, is about a 2/10 to 3/10.  She gets fair relief with the current medications that are prescribed  from this clinic.   MEDICATIONS FROM THIS CLINIC:  1. Kadian 80 mg twice a day.  2. Morphine sulfate immediate release 15 mg 3 times a day p.r.n. for      pain in her ribs or tailbone areas.  3. She does take ibuprofen on a p.r.n. basis.  4. Lyrica 75 mg twice a day.   She has discontinued Percocet last month.   She is able to walk about 30 minutes at a time.  She is able to climb  stairs.  She does not drive.   REVIEW OF SYSTEMS:  On the health and history form attached to the  chart.  No other changes in past medical history, social history, or  family history, other than noted above.   EXAM:  Blood pressure is 143/82, pulse 90, respirations 16, 96%  saturation on room air.  She is a well-developed, well-nourished female who appears her stated  age.  She is oriented x3.  Her speech is clear.  Her affect is bright,  alert.  She is cooperative and pleasant.  She laughs at times.  She  follows commands without difficulty.  She transitions from sitting to standing quite easily.  Her gait in  the  room is stable.  Not antalgic.  Tandem gait, Romberg's test are  performed adequately.  She does have some diminished range of motion  with rotation of her neck to the right and left.  She lacks full range  of motion in her right shoulder.  She is able to abduct to approximately  110 degrees on that side.  She has full range of motion on the left.  Reflexes are symmetric in the upper extremities, 2+, 1+ at the patellar  tendons and Achilles tendons.  SENSORY EXAM:  Lower extremities are intact to light touch, pinprick.  Slightly decreased sensation along the plantar surface of the great toe  compared to the left, but definitely is intact to pinprick.  No open areas are noted on either foot.  Motor strength is in the 5/5 range.  No focal deficits are appreciated  at this time.   IMPRESSION:  1. Breast cancer diagnosed 1997 with metastasis to sacrum and liver.      She is currently under the care of Dr. Rolly Salter over at [facility] and has been started on 2 new injections,      which are described  above.  2. 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  Last      bone scan showed some increased activity in the area of the right      medial clavicle, as well as the medial second left rib.  3. Decrease range of motion in the right shoulder, multifactorial in      nature.  4. Status post radiation to the sacral region with positive bone scan      in the right sacral area consistent with metastatic disease.  5. History of coxodynia.  6. New diagnosis of bilateral lower extremity paresthesias and      dysesthesias.  Worse nocturnally.   PLAN:  Will trial her on some Lidoderm.  Again, she did not tolerate  well on her chest wall, but may do better with the patches on her lower  extremities.  Will also give her some samples of Biofreeze today to  trial when she is not using the Lidoderm patch.  May also try capsaicin  at some point.  Will refill  her morphine sulfate IR 15 mg 1 p.o. t.i.d.  p.r.n. pain in ribs and tailbone #90, and refill her Kadian 80 mg 1 p.o.  b.i.d. #60, no refills.  She continues to use ibuprofen 400 mg once or  twice a day on a p.r.n. basis.  She continues to take Lyrica 75 mg twice  a day.  She does not need a refill on that today.  She has been stable  on medications provided by this clinic.  She has displayed no aberrant  behavior.  She uses her medications appropriately and is able to  maintain a functional lifestyle at this point.  Will see her back in a  month.           ______________________________  Brantley Stage, M.D.     DMK/MedQ  D:  07/26/2006 13:30:22  T:  07/26/2006 16:36:20  Job #:  [id number]   cc:   Dr. Rolly Salter  [facility]

## 2010-06-21 NOTE — Assessment & Plan Note (Signed)
[name] is a 65 year old, married female, who is being seen in our  Pain and Rehabilitative Clinic today.  She has a history of multiple  pain complaints, which are mostly related to her metastatic breast  cancer.   She was last seen by me on July 26, 2006.   She is back in today for a refill of her medication.   She continues to follow up closely with Dr. Rolly Salter at [facility].  She states that she is set up for a CT scan tomorrow.  She  has been continuing to undergo chemotherapy, apparently her last round  was held.  She had been having problems with significant numbness and  burning of her feet.   Her average pain is about a 3 or 4 on a scale of 10, mostly about a 3  during peak relief times, otherwise it is around a 4.  She reports fair  relief with the current meds that she is on.  Pain is typically  localized to the tailbone, the feet, or the chest wall.   She can be up on her feet about two hours at a time; however, her feet  do bother her after she has been up for awhile.  She is able to climb  stairs, she is independent with her self-care, she is able to do some  cooking and some light housework.  No other changes in her past medical,  social, or family history since last visit.   MEDICATIONS PRESCRIBED BY THIS CLINIC:  1. Morphine sulfate 15 mg Immediate Release one p.o. t.i.d.  2. Kadian 80 mg one p.o. b.i.d.  3. Lidoderm p.r.n.  4. Lyrica 75 mg one p.o. b.i.d.   PHYSICAL EXAMINATION:  VITAL SIGNS:  Blood pressure is 142/88, pulse 82,  respirations 20, 97% saturated on room air.  GENERAL:  She is a well-developed, well-nourished female, who appears  her stated age.  She currently is wearing a wig.   She is oriented x3.  Her speech is clear.  Affect is bright, alert.  She  is cooperative and pleasant.  She follows commands without difficulty.   Transitions from sit to stand with ease.  Gait in the room is  nonantalgic.  Tandem gait and Romberg's test are  performed adequately.   Cervical range of motion is assessed and is within normal limits.  Shoulder range of motion is diminished, especially on the right.  She  lacks full abduction, has approximately about 90 degrees.  Reflexes are  diminished throughout both upper and lower extremities.  No abnormal  tone is noted.  Motor strength, however, is good throughout both upper  and lower extremities without any focal weakness.  Pinprick and light  touch are assessed.  She reports intact sensation to pinprick today in  the feet.   She was noted, however, to have some slightly decreased sensation along  the plantar surfaces of the great toe on the left compared to the right  at last visit.   IMPRESSION:  1. Breast cancer diagnosed in 1997 with metastases to sacrum and liver      and anterior ribs.  She is under the care of Dr. Rolly Salter over at      [facility], [place].  2. 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  3. Decreased range of motion, right shoulder, multifactorial nature.  4. Status post radiation to sacral region.  5. History of coccydynia.  6.  The new diagnosis of bilateral lower extremity paresthesias and      dysesthesias, which seem to be worsening.  Initially they were more      nocturnally, now they are bothering her during the day as well.   PLAN:  Will continue her on Lyrica 75 mg one p.o. b.i.d. as well as  Lidoderm on a p.r.n. basis.  She does take ibuprofen 400 mg twice a day.  We will refill her morphine sulfate immediate release 15 mg one p.o.  t.i.d. p.r.n. with pain in ribs and tailbone, #90, no refills, Kadian 80  mg one p.o. b.i.d., #60, no refills.  Encouraged her to take Tylenol 500  mg up to 4 times a day as well.  She has been stable on her pain  medications, she takes the meds as prescribed, she has not displayed any  aberrant behavior, she is getting fair relief with the medications she  is on  currently.           ______________________________  Brantley Stage, M.D.     DMK/MedQ  D:  08/30/2006 11:47:19  T:  08/30/2006 19:54:11  Job #:  [id number]   cc:   Dr. Rolly Salter, M.D.  [facility]

## 2010-06-21 NOTE — Assessment & Plan Note (Signed)
[name] is a 65 year old married woman who is being followed in  our Pain and Rehabilitative Clinic for chronic pain complaints related  to metastatic breast cancer.  She has chronic right-sided chest wall  pain and sacral pain.  She also has peripheral neuropathy secondary to  chemotherapy.   She was last seen by me on February 26, 2008.  In the interim, she has  had a nursing visit to refill her medications.   She continues to be followed by Dr. Lillia Mountain, who is her Oncologist in  [place] and he has her on Xeloda.   She reports her average pain to be between 3 and 4 on a scale of 10.  Pain is worse in the morning and toward the end of the day, worse with  activities, improves with rest and medication.  She reports good relief  with current meds.   FUNCTIONAL STATUS:  She is able to walk about 20 minutes at a time.  She  can climb stairs and drive.  She is independent with self-care.  Overall, high functioning individual.   No drug allergies noted.   Medications provided by this clinic include:  1. Kadian 80 mg twice a day.  2. Morphine sulfate immediate release 15 mg up to 3 times per day.  3. Lyrica 75 mg twice a day.   REVIEW OF SYSTEMS:  Negative for problems controlling bowel or bladder.  Denies depression or anxiety.  Denies suicidal ideation.  Admits to some  numbness especially in the distal extremities and occasional nausea.  She reports that she did eat some broccoli over a couple of days ago,  had quite a bit of burping after she ate it.   No changes in past medical, social, or family history.   PHYSICAL EXAMINATION:  VITAL SIGNS:  Blood pressure is 139/75, pulse is  89, respiration 18, and 96% saturated on room air.  GENERAL:  Well-developed, well-nourished woman who does not appear in  any distress.  NEUROLOGIC:  She is oriented x3.  Speech is clear.  Affect is bright.  She is alert, cooperative, and pleasant.  Answers questions  appropriately and  follows commands without difficulty.   Cranial nerves and coordination are intact.  Reflexes are diminished in  the lower extremities and 1+ in the upper extremities.  No abnormal tone  is noted.  No clonus is noted.  No tremors are appreciated.   Motor strength is good in the upper extremities without focal deficits.  In lower extremity, she has some mild hip flexor weakness bilaterally.  Distally 5/5 at knee extensors, knee flexors, dorsiflexors, plantar  flexors, and hip extensor at 5/5 as well.   She has limitations in right shoulder range motion with abduction as  well as internal and external rotation.   She has not changes in her hands related to the chemotherapy.  The skin  is tight erythematous in both hands throughout all digits.   Mild sensory deficits in bilateral distal extremities are noted.   IMPRESSION:  1. Chronic pain.  2. History of breast cancer diagnosed in 1997 with metastases and it      passed to the sacrum, liver, anterior ribs, currently under the      care of Dr. Lillia Mountain, on Xeloda per Dr. Lillia Mountain.  3. Paresthesias and peripheral neuropathy in hands and feet,      dysesthetic pain especially in feet, well controlled with Lyrica,      currently.  4. History of chest wall  pain secondary to prosthetic chest wall as      well as metastasis.  5. History of right shoulder dysfunction, which is multifactorial in      nature.  6. Status post radiation to the right sacrum with history of      pathologic fracture.  7. Intermittent constipation, currently not a problem at this time.   PLAN:  We refill the following medications from Mariah Grimes today:  1. Lyrica 75 mg 1 p.o. b.i.d. #60, 2 refills.  2. Morphine sulfate immediate release 15 mg up to 3 times per day for      chest wall or sacral pain, #72.  3. Kadian 80 mg 1 p.o. b.i.d. #60, no refills.  We will see her back      next month.   She has been taking her medications as prescribed.  She does not exhibit   any aberrant behavior with her narcotics.  She reports overall good pain  relief and is able to engage in relatively active lifestyle despite  multiple pain complaints.   She has not had any significant problems in the last few months with  constipation or oversedation.  We will see her back in 2 months and  nursing visit next month for refill of her medications.           ______________________________  Brantley Stage, M.D.     DMK/MedQ  D:  04/22/2008 12:08:29  T:  04/23/2008 00:11:50  Job #:  [id number]   cc:   Dr.  Lillia Mountain, [facility]

## 2010-06-21 NOTE — Assessment & Plan Note (Signed)
Mariah Grimes is a 65 year old married female who is being seen in our pain  and rehabilitative clinic for multiple chronic pain complaints which are  mostly related to metastatic breast cancer.   She has chronic chest wall pain as well as intermittent sacral pain.   She states in the interim since I last saw her she has been hospitalized  from 04/01/07 to 04/09/07 for partial bowel obstruction.   She states her average pain in her chest wall and sacral region is about  a 4 on a scale of 10.  Pain is described as constant and dull.  Moderately to significantly interfering with activities and enjoyment of  life.   Sleep tends to be fair.  Pain is typically worse in the evening.  Worse  with standing and bending, walking. Improves with rest and medications.  She gets fair relief with current meds prescribed.   Functional status is as follows.  She is able to walk 20 minutes at a  time.  She is able to climb stairs and she currently does not drive.  She is independent with her self care and needs some assistance of  household duties and shopping.   She admits to some numbness especially in the periphery.  Otherwise no  change in review of systems at this time.   Past medical, social, family history is noted above. No changes in  social or family history.   MEDICATIONS:  From this clinic include:  1. Morphine sulfate immediately release 15 mg 1 p.o. t.i.d.  2. Kadian 80 mg 1 p.o. b.i.d.  3. Lyrica 75 mg twice a day.   PHYSICAL EXAMINATION:  VITALS:  Her blood pressure is 115/73, pulse 90,  respirations 18, 96% saturated on room air.  She is a well-developed,  well-nourished female who does not appear in any distress.  She is  oriented x 3.  Her speech is clear.  Her affect is bright.  She is  alert, cooperative and pleasant.  She follows commands easily.   Transitioning from sitting to standing with ease, gait in the room is  non antalgic.  She has a little trouble with tandem gait  Romberg's test.  She has limitations in shoulder range of motion bilaterally.  Abduction  is limited to about 100 degrees bilaterally.   Reflexes are diminished overall. She has diminished sensation in the  distal extremities. She has normal tone in both upper and lower  extremities.  Some erythema is noted in both hands and feet.   IMPRESSION:  1. Breast cancer diagnosed in 1997 with metastases to sacrum, liver,      anterior ribs.  Currently under the care of Dr. Lillia Mountain.  2. Paresthesias and erythema bilateral hands, attributed to      chemotherapy.  3. 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  4. Decreased shoulder range of motion which is multifactorial in      nature.  5. Status post radiation of the right sacrum with history of      pathological fracture.  6. History of coccydynia.  7. Constipation intermittently.   PLAN:  Will change her medications today slightly.  She had been taking  80 mg of Kadian twice a day.  However, after her hospitalization she  discovered she did not need to take quite as much immediate release  morphine.  Today will decrease her Kadian to 60 mg 1 p.o. b.i.d. #  60 and  she does not need a refill on her immediate release 15 mg morphine.  She  will take between 2 and 4 tablets per day of this to help manage her  pain.   Reviewed bowel program with her, Dulcolax tablets on a p.r.n. basis,  Colace daily as a stool softener.  She has currently been given recently  some MiraLax.  Also encouraged fluids and walking to help maintain good  bowel habits.  Will see her back in 3 weeks.           ______________________________  Brantley Stage, M.D.     DMK/MedQ  D:  04/17/2007 15:18:11  T:  04/18/2007 08:40:09  Job #:  [id number]

## 2010-06-21 NOTE — Assessment & Plan Note (Signed)
HISTORY:  [name] is a 65 year old married woman who is being followed  in our pain and rehabilitative clinic for pain related to her metastatic  breast cancer.  She is back in today and states that she has had a port  removed recently.  She has had another CT scan.  I do not have these  results.  She has been taken off of her Xeloda for the last 2 weeks.   Overall, she feels improvement in her energy level.  Her pain scores are  about 2-3 on a scale of 10 which is an overall improvement from last  month which was between 3-4.  She notes that her pain interferes between  a little and moderate with her activity level.  Sleep is fair.  Pain is  worse with walking, bending, standing.  She does have some right medial  arch pain when she is up walking.  She has been engaging in some  Achilles stretching exercises as well as some heel raises.  She reports  a little bit of an improvement from last month.  She reports good relief  with the current medications prescribed by this clinic for her pain.   MEDICATIONS:  1. Immediate relief morphine sulfate 15 mg 1 p.o. t.i.d.  2. Lyrica 75 mg 1 p.o. b.i.d.  3. Kadian 80 mg 1 p.o. b.i.d.   REVIEW OF SYSTEMS:  Otherwise noncontributory.  She does have some  numbness in her feet which is not new.   Past medical history, social/family history are unchanged.   PHYSICAL EXAMINATION:  VITAL SIGNS:  Blood pressure 147/66, pulse 78,  respirations 20, 97% saturated on room air.  GENERAL:  She is a well-developed, well-nourished female who does not  appear in any distress today.  She is oriented x3.  Her speech is clear.  Her affect is bright.  She is alert, cooperative and pleasant.  She  follows commands without difficulty.  EXTREMITIES:  Transitioning from sitting to standing is done with ease.  Her gait in the room is not antalgic which is improved from last visit.  Her right shoulder, she has limitations with abduction.  Actively, she  can abduct to  approximately 100 degrees on the right.  She has full  range of motion on the left.  Her reflexes are diminished in both lower  extremities.  Her strength is good without focal deficit.  She has  tenderness over the medial arch on the right.  Achilles tendons are  essentially nontender bilaterally.  No tenderness is noted over the  metatarsal head.  No tenderness is noted directly over the heel.  BACK:  She has mild limitations in lumbar motion.   IMPRESSION:  1. Improving plantar fasciitis on the right.  The patient will      continue to do some gentle stretching and strengthening exercises.      She was also going to look into a new pair of walking shoes.  2. Breast cancer diagnosed in 1997 with metastases to the sacrum,      liver and anterior ribs, currently under the care of Dr. Virginia Grimes      undergoing chemotherapy which has recently been discontinued for      the last 2 weeks.  3. Paresthesias and erythema of bilateral hands and feet over the last      couple of months which is somewhat improved today.  4. 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  Pain typically is worse with activity.  5. Decreased shoulder range of motion which is multifactorial.  Would      like the patient to continue to do range of motion exercises daily      to maintain the function of this right shoulder.  6. Status post radiation of the right sacrum with history of      pathologic fracture.  7. Intermittent constipation which is currently not a problem.   PLAN:  We will refill the following medications for her today:  Morphine  sulfate IR 15 mg 1 p.o. t.i.d. p.r.n. chest wall pain, number 90 and  Kadian 80 mg 1 p.o. b.i.d., number 60.  [name] has been stable on  these medications.  She takes them as prescribed.  She has not exhibited  any aberrant behavior with her use and her cell counts are appropriate.  We will see her back in a month.            ______________________________  Brantley Stage, M.D.     DMK/MedQ  D:  06/07/2007 14:35:25  T:  06/07/2007 15:16:32  Job #:  [id number]

## 2010-06-21 NOTE — Assessment & Plan Note (Signed)
Mariah Grimes is a pleasant 65 year old woman who is followed in our  Pain and Rehabilitative Clinic for chronic pain which is related to  metastatic breast cancer.  She has chronic chest wall pain and sacral  pain.  She also has peripheral neuropathy secondary to chemotherapy.   She is back in today for refill of her medications from this clinic.   She is also followed by Dr. Vella Raring who is her oncologist [facility].   She reports her average pain with medication use is about 3-4 in a scale  of 10, predominantly located in the right anterior chest wall region, as  well as in the sacral region.   Since last visit, she reports no new medical problems.  She has been  staying active.  No problems with respect to mobility or function at  this time.   She denies confusion, depression, or anxiety.  Denies suicidal ideation.   REVIEW OF SYSTEMS:  Otherwise negative.   No changes in past medical, social, or family history.   MEDICATIONS:  Prescribed through this clinic include,  1. Kadian 80 mg one p.o. b.i.d.  2. Morphine sulfate 15 mg one p.o. t.i.d. p.r.n.  3. Lyrica 75 mg one p.o. b.i.d.   She reports no problems with these medications.  Denies oversedation,  constipation, any kind of limb swelling.   PHYSICAL EXAMINATION:  Today, blood pressure is 124/67, pulse is 80,  respiration 18, 98% saturated on room air.  She is a well-developed,  well-nourished woman who does not appear in any distress.   She is oriented x3.  Speech is clear.  Affect is bright.  She is alert,  cooperative, and pleasant.  Follows commands without difficulty and  answers questions appropriately.   Cranial nerves and coordination are intact.  Her reflexes are diminished  in the lower extremity, 2+ in the upper extremities.  Sensation is  diminished in the distal lower extremities, intact in the upper  extremities.   She has limitations in right shoulder range of motion.   She has changes in her  hands related to the chemotherapy.   The skin is tight and somewhat erythematous in both hands throughout all  digits.   IMPRESSION:  1. History of breast cancer diagnosed in 1997 with metastasis to the      sacrum, liver, and anterior ribs, currently under the care of Dr.      Vella Raring, has been on Xeloda per Dr. Vella Raring.  2. Paresthesias and peripheral neuropathy, hands and feet with      dysesthetic pain especially in the feet appears to be well      controlled with Lyrica.  3. Chest wall pain secondary to prosthetic chest wall as well as      metastasis.  4. History of right shoulder dysfunction which is multifactorial in      nature.  5. Status post radiation to the right sacrum with history of      pathologic fracture.  6. Intermittent constipation which is not a problem at this time.   PLAN:  Refilled the following medications for her.  1. Kadian 80 mg one p.o. b.i.d. #60.  2. Morphine sulfate immediate release 15 mg one p.o. t.i.d. p.r.n.      chest wall pain or sacral pain #90 no refills.   She does not need a refill on her Lyrica right now.  She takes 75 mg  twice a day.  She will need a refill in April.   Mariah Grimes's pill counts are appropriate.  She takes her medications as  prescribed.  No aberrant behavior has been observed.  She reports  overall good pain relief with their use and is able to engage in a  relatively active lifestyle despite metastatic breast cancer.  We will  see her back in a nursing visit next month for refill of medications.           ______________________________  Brantley Stage, M.D.     DMK/MedQ  D:  02/26/2008 13:53:29  T:  02/27/2008 02:21:05  Job #:  [id number]

## 2010-06-21 NOTE — Assessment & Plan Note (Signed)
Mariah Grimes is a 65 year old woman who is followed in our [facility] for chest wall pain and sacral pain related to  metastatic breast cancer.  She is also followed by Dr. Lillia Mountain who is her  oncologist in [place].  She is currently off of her chemotherapy  at present.  She is back into our [facility] for a  refill on her Lyrica and her morphine IR as well as her Kadian.   She was last seen by me on July 31, 2007.  In the interim, she reports  having an ingrown toenail removed on August 29, 2007, by Dr. Brynda Greathouse and is  currently on ampicillin for that and is in a orthopedic sandal.   Overall, in the last month, she has done quite well.  Her average pain  is about 2-3 on a scale of 10, interfering little with her activities.  Sleep is fair.  Pain is typically worse with walking, standing, bending,  and improves with medication.   She gets fair-to-good relief with current meds, which are prescribed  through this clinic.   Medications from this clinic include, immediate release morphine sulfate  15 mg up to 3 times a day on a p.r.n. basis, Lyrica 75 mg 1 p.o. b.i.d.,  and Kadian 80 mg 1 p.o. b.i.d.  Occasionally, she does take ibuprofen as  well.   Functional status is as follows; she is able to walk 20 minutes at a  time.  She is able to climb stairs.  She does not drive.  She is  independent with her self care.  Needs some assistance with heavier  household tasks.   Review of systems is positive for numbness in her feet and tingling in  her feet.  She denies any problems with constipation, over sedation,  depression, anxiety, or suicidal ideation.   No other changes in past medical, social, or family history.   Exam today, blood pressure is 141/70, pulse 74, respiration 18, and 98%  saturated on room air.  She is a well-developed, well-nourished female  who does not appear in any distress.  She is oriented x3.  Speech is  clear.  Her affect  is bright.  She is alert, cooperative, and pleasant.  She follows commands without difficulty.   Cranial nerves are grossly intact.  Coordination is intact.  Reflexes  are diminished in the upper and lower extremities.  Sensation is  diminished below the knee.  The left foot was not examined as it was in  a bandage and in the orthopedic sandal, status post surgery on the great  toe.   Musculoskeletal exam reveals slightly decreased range of motion in the  cervical spine.  She has difficulty raising the right shoulder past 90  degrees today.  She has diminished internal and external rotation on the  right side as well.   IMPRESSION:  1. History of breast cancer, diagnosed in 1997 with metastasis to the      sacrum, liver, and anterior ribs, currently under the care of Dr.      Lillia Mountain, off her chemotherapy this week.  2. Paresthesias in the hands and feet.  3. 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  Pain is typically worsened with activity.  4. Decreased range of motion in the shoulder which is multifactorial      in nature.  5. Status post radiation to the right sacrum with  a history of      pathologic fracture.  6. Intermittent constipation, which is currently controlled with stool      softeners at this time and is not a problem.   PLAN:  I refilled her medications today.  Kadian 80 mg 1 p.o. b.i.d.,  #60, no refills; morphine sulfate IR 15 mg 1 p.o. t.i.d. p.r.n. chest  wall or sacral pain, #90; Lyrica 75 mg 1 p.o. b.i.d., #60 with 3 refills  on the Lyrica; no refills on the morphine.  She does take over-the-  counter Advil occasionally as well, not more than 2 per day.   She has been stable on the above medications.  She does not have any  complaints of over sedation or constipation at this time from these  medications.  She has been able to maintain her activity level.  She is  currently satisfied with the current level of pain  management.  She does  not exhibit any evidence of aberrant behavior.  We will see her back in  1 month for recheck and refill of medications.           ______________________________  Brantley Stage, M.D.     DMK/MedQ  D:  08/30/2007 12:44:58  T:  08/31/2007 04:07:47  Job #:  [id number]   cc:   Louann Liv, MD

## 2010-06-21 NOTE — Assessment & Plan Note (Signed)
[name] is a 65 year old married female, who is followed in our [facility] for chronic complaints related to metastatic  breast cancer.  She has chronic right-sided chest wall pain, sacral  pain.  She also has peripheral neuropathy secondary to chemotherapy, is  currently on Xeloda.   She is back in today requesting refills of her Kadian and her morphine  sulfate immediate release.   She also uses Lyrica 75 mg twice a day, does not need a refill on this  today.   Average pain is 2-3/4.  Pain is localized to the sacral region and the  right anterior chest wall.  Also, has some discomfort along the  trochanter on the right.   She was unable to participate in physical therapy last month.  She  believes that something may have come up that she could not get  involved.  She would like to try again this month.   Pain is typically worse with walking, bending, standing, and improves  with rest, exercise, and medication.  She reports fair to good relief  with current meds.   FUNCTIONAL STATUS:  She can walk 20 minutes at a time.  She will climb  stairs and drive.  She is independent with self-care.  Admits to  numbness and tingling especially in the hands and feet.  She has had  some recent problems with flatulence and abdominal gas.  She is  wondering what to do about that.  I have asked her to follow up with  primary care for that.   No other changes in past medical, social, or family history.  She  indicates that she has had recent CT of chest, abdomen, and pelvis.  I  do not have results regarding that today.   Past medical, social, and family history unchanged.  Attempt to contact  Dr. Lillia Mountain over at [facility], unable to contact him today.  Phone  number use was [phone], area code 56.   Medications provided through this clinic include the following:  1. Kadian 90 mg 1 p.o. b.i.d.  2. Morphine sulfate IR 15 mg 1 p.o. t.i.d.  3. Lyrica 75 mg 1 p.o.  b.i.d.   PHYSICAL EXAMINATION:  VITAL SIGNS:  Blood pressure is 103/51, pulse 79,  respiration 18, and 94% saturated on room air.  GENERAL:  She is a thin, dull female, who does not appear in any  distress.  NEUROLOGIC:  She is oriented x3.  Speech is clear.  Affect is bright.  She is alert, cooperative, pleasant.  Follows commands without  difficulty, answers questions appropriately.  Coordination, cranial  nerves are grossly intact.  MUSCULOSKELETAL:  Reflexes are diminished in lower extremities and 1+ in  the upper extremities.  No abnormal tone is noted.  No clonus is noted.  No tremors are appreciated.  Motor strength is good in the upper  extremities.  Lower extremities, some hip flexion is 4+/5, hip extension  generally good, but she states that she has difficulty rising from lower  chair occasionally. Knee extensors and dorsi and plantar flexors are all  5/5.  Some difficulty noted with balance with tandem gait.  Romberg test  performed adequately.  Gait is non-antalgic.  She has some mild deficits  in range of motion in the right shoulder.  Hands and feet are positive  for erythematous, shiny skin, tender to palpation.   IMPRESSION:  1. Paresthesias, peripheral neuropathy, and hands and feet dysesthetic      pain  especially in feet, well controlled with Lyrica currently.  2. History of chest wall pain secondary to prosthetic chest wall as      well as metastasis.  3. History of right shoulder dysfunction.  4. Status post radiation to right sacrum with history of pathologic      fracture.  5. Intermittent constipation, currently not a problem at this time.   PLAN:  We will refill the following medications for her today, Kadian 90  mg 1 p.o. b.i.d. and morphine sulfate immediate release 15 mg up to 3  times per day p.r.n. chest wall pain or sacral pain.   We will write physical therapy orders to address lower extremity  strength deficits and balance problems, and address  the iliotibial band  on the right as well.   She is in agreement with this.  We discussed possibly decreasing her  Kadian.  She is going to think about this.  Attempt to call Dr. Lillia Mountain  today, unable to get through.  We will see her back in 2 months, nursing  visit next month for pill count and medications.           ______________________________  Brantley Stage, M.D.     DMK/MedQ  D:  06/03/2008 13:46:29  T:  06/04/2008 02:02:59  Job #:  [id number]

## 2010-06-21 NOTE — Assessment & Plan Note (Signed)
Mariah Grimes is a 65 year old woman who was last seen by me on August 05, 2008.  In the interim, she has had a visit with Dr. Rolly Salter, she saw  him 2 weeks ago.  She continues to take Xeloda, chemotherapeutic agent,  for metastatic breast cancer.   She has been in physical therapy for her right lower extremity.  Continues to have some pain especially in the distal half.  Pain is  worse with walking, standing, improves with rest and medications.  Her  average pain is between a 3 and a 5 on a scale of 10.  She continues to  have some discomfort in the right lateral hip and in the sacral region  as well as in the anterior chest wall, mainly in the region of the  sternum.   Pain is worse in the morning and at night.   FUNCTIONAL STATUS:  She is able to walk 50-20 minutes at a time.  She is  able to climb stairs and drive.  She is independent with self-care.  Admits to some numbness in hands and feet.  Denies depression, anxiety,  or suicidal ideation.  Denies problems controlling bowel or bladder.  Denies any new weakness.   PAST MEDICAL HISTORY:  Unremarkable at this time.   PHYSICAL EXAMINATION:  Blood pressure is 143/70, pulse 79, respiration  18, 97% saturated on room air.  She is a well-developed, well-nourished  female who does not appear in any distress.  She is oriented x3.  Speech  is clear.  Affect is bright.  She is alert, cooperative, and pleasant.  Follows commands without difficulty.  Answers my questions  appropriately.  Cranial nerves and coordination are intact.  Reflexes  are diminished in the lower extremities at patellar and Achilles  tendons, 1+ at the biceps, triceps, brachioradialis.  She reports intact  sensation to pinprick as well as light touch in both lower extremities  at this time.  This is an area of slightly diminished sensation to  pinprick over the dorsum of the right foot.  Motor strength is, however,  good in both lower extremities at hip flexors, knee  extensors,  dorsiflexors, plantar flexors.   Transitioning from sitting to standing is done without difficulty.  She  does have slightly antalgic gait, decreased stance phase on the right,  short and stride length on the right as well.   Mild tenderness to palpation over the lumbosacral area, also tenderness  at the Achilles tendon about 2 cm proximal to the insertion of the  Achilles tendons.   IMPRESSION:  1. Metastatic breast cancer.  2. Improved paresthesias in hands and feet over the last couple of      months.  3. History of chest wall pain secondary to prosthetic chest wall as      well as metastases.  4. History of right shoulder dysfunction.  5. Status post radiation to the right sacral area with history of      pathologic fractures.  6. Intermittent constipation, not a problem at this time.  7. Right Achilles tendonitis.   PLAN:  Encouraged the patient to continue to follow up in physical  therapy to address Achilles tendonitis.  On discussion regarding her  pain medications today, she is currently on 80 mg twice a day of Kadian.  Suggestion of switching her from morphine sulfate IR 15 mg t.i.d. to 5  mg of oxycodone 4 times today.  She will think about this, may consider  it at  the next scheduled appointment.  She is going to follow up with  Dr. Rolly Salter.  She indicates to me that there are some imaging studies  planned for September for her.  We will have nursing staff refill her  medications next month.  We will see her back in 2 months.  Mariah Grimes  takes her medications as prescribed.  No  aberrant behavior has been observed.  She does not have any problems  with oversedation or constipation at this time, and she reports overall  good relief with current meds.           ______________________________  Brantley Stage, M.D.     DMK/MedQ  D:  10/02/2008 11:25:09  T:  10/03/2008 02:48:33  Job #:  [id number]

## 2010-06-21 NOTE — Assessment & Plan Note (Signed)
The patient is a 64 year old married female who is being seen in our  pain and rehabilitative clinic for multiple pain complaints mostly  related to metastatic breast cancer.   She is back in today for a refill of her medication.  She was last seen  by me on 10/24/2006.   She states her average pain is about a 3 on a scale of 10, localized to  the chest wall as well as the sacral area.  She is known to have a  pathologic fracture in that area and a chest wall prosthesis status post  surgery for her breast cancer.   She states her average pain is about a 3 on a scale of 10.  Sleep is  fair.  She gets between fair and good relief with the medications that  she is on from this clinic.  Pain is described as constant and dull in  nature, worse with bending and standing, improves with rest and  medications.   She is walking about 20 minutes at a time.  She is able to climb stairs.  She does not drive.  She is independent with her self-care, needs some  assistance with higher level household tasks.   Admits to occasional nausea, constipation, and urinary retention.  No  other changes in past medical, social or family history.  She continues  to be on Xeloda which is a chemotherapeutic drug to help manage her  cancer.  She states she is tolerating this much better than the last  medication she had been on, is not having any burning pain in her hands  or feet at this point.   PHYSICAL EXAMINATION:  Blood pressure:  138/86.  Pulse:  92.  Respirations:  18.  97% saturation on room air.  In general, she is a  well-developed, well-nourished female who appears her stated age.  She  is oriented x 3.  Speech is clear.  Affect is bright, alert, cooperative  and pleasant.  Follows commands without difficulty.  Transitions from  sitting to standing with ease.  Gait in the room is normal.  Tandem  gait, Romberg's test are performed adequately.  She has mild limitation  of cervical range of motion,  mild limitations especially in the right  shoulder with abduction.  Forward flexion is performed adequately.  Motor strength is good in both upper and lower extremities without focal  weakness.  Reflexes are 1 to 2+ in the upper extremities and symmetric,  0 at the knees and ankles.  No abnormal tone is noted in the upper or  lower extremities.   IMPRESSION:  1. Breast cancer diagnosed in 1997 with metastases to sacrum, liver,      anterior ribs, currently under the care of Dr. Louann Liv.  2. 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  3. Decreased range of motion right shoulder, multifactorial in nature.  4. Status post radiation to the right sacral region with a history of      pathologic fracture.  5. History of coccyodynia.   PLAN:  We will refill the following medications for her:  Kadian 80 mg  one p.o. b.i.d. #60, no refills, morphine sulfate immediate release 15  mg one p.o. t.i.d. p.r.n. chest wall pain #90, no refills.  We will see  her back in a month.  She has been stable on these pain medications.  She takes them as directed, has not had any problems with constipation  or oversedation with their use.  She also takes over-the-counter  ibuprofen up to twice a day, Tylenol intake is 1,000 mg per day maximum.  She is getting fair relief with the current medications and is able to  maintain a relatively functional lifestyle despite some impairment she  currently has.          ______________________________  Brantley Stage, M.D.    DMK/MedQ  D:  11/21/2006 14:33:53  T:  11/22/2006 11:32:04  Job #:  [id number]

## 2010-06-21 NOTE — Assessment & Plan Note (Signed)
HISTORY OF PRESENT ILLNESS:  [name] is a 65 year old married  woman who is followed in our [facility] for chronic  chest wall pain and sacral pain, which is related to metastatic breast  cancer.  She is also followed by Dr. Lillia Mountain, who is her oncologist in  [place].  She is back in today for refill of her Kadian as well as  her morphine sulfate.  She states that she has been doing well.  She has  been very active.  She states she has been shopping for hours.  No new  medical problems.  She has had some chemotherapy again, which has caused  her hands to redden and peel.  She follows up with Dr. Lillia Mountain on December 09, 2007 again for reevaluation.   Average pain in the chest wall and sacral area is about a 3-4 on a scale  of 10.  Described as constant and dull in nature, moderately interfering  with activity level.  Sleep is fair.  She gets fairly good relief with  current meds.  Pain is typically worse with walking, bending, or  standing; improves with rest and medication.   FUNCTIONAL STATUS:  She walks independently.  She still walks about 30  minutes at a time.  She can climb stairs.  She does not drive.  She is  independent with self-care.   REVIEW OF SYSTEMS:  Positive for numbness in the hands and feet,  otherwise negative.   Past medical, social, and family history otherwise unchanged.   MEDICATIONS:  Prescribed through this clinic include;  1. Morphine sulfate 15 mf 1 p.o. q.i.d. p.r.n. chest wall pain or      sacral pain.  2. Kadian 80 mg 1 p.o. b.i.d.   PHYSICAL EXAMINATION:  VITAL SIGNS:  Blood pressure is 157/75, pulse 78,  respirations 18, and 97% saturated on room air.  GENERAL:  She is a well-developed, well-nourished woman, who does not  appear in any distress.  She is oriented x3.  Speech is clear.  Affect  is bright.  She is alert, cooperative, and pleasant.  She follows  commands without any difficulty.  CNS:  Cranial nerves are  grossly intact.  Coordination is intact.  Reflexes are diminished in the lower extremities.  Motor strength is 5/5  in the lower extremities and upper extremities.  MUSCULOSKELETAL:  Reveals diminished range of motion with rotation of  her neck to the left, mild limitations to the right with extension.  She  has limitations with right shoulder range of motion and is able to  abduct to about 110 degrees.  Left shoulder, she is able to abduct to  about 150-160 degrees.   No tenderness to palpation down the cervical, thoracic, or lumbar spine  with palpation today.   IMPRESSION:  1. History of breast cancer diagnosed in 1997 with metastasis to the      sacrum, liver, and anterior ribs, currently under the care of  Dr. Lillia Mountain, has been on Xeloda per Dr. Lillia Mountain.  1. Paresthesias and peripheral neuropathy of hands and feet with      dysesthetic pain, especially in the feet.  2. Chest wall pain secondary to prosthetic chest wall as well as      metastasis.  3. History of right shoulder dysfunction, which is multifactorial in      nature.  4. Status post radiation to the right sacrum with history of      pathologic fraction.  5.  Intermittent constipation, which is currently not a problem at this      time.  6. Resolution of left great toe areas of inflammation.   PLAN:  1. Refill her morphine sulfate IR 15 mg tablet p.o. t.i.d. p.r.n.      chest wall pain or sacral pain.  2. Kadian 80 mg 1 p.o. b.i.d., #60, no refills.  3. Her next visit will fall during Thanksgiving, during which the      clinic is closed.  We will have her follow up with nursing staff      for refill of her medications, and I will see her back in December.      She has currently been stable on the above medications.  She does      not exhibit any aberrant behavior.  She self reports fairly good      relief with current medications and is able to maintain a      relatively functionally lifestyle and has been rather  active      despite diagnosis of metastatic breast cancer.  We will see her      back in a month for nursing visit and I will see her back in 2      months.           ______________________________  Brantley Stage, M.D.     DMK/MedQ  D:  12/04/2007 10:21:43  T:  12/04/2007 22:57:27  Job #:  [id number]   cc:   Louann Liv, MD

## 2010-06-21 NOTE — Assessment & Plan Note (Signed)
Mariah Grimes is a 65 year old married female who is being seen in our [facility] for pain complaints related to metastatic  breast cancer.   She is back in today for a refill of medications.  She was trialed about  a week ago on Percocet.  She had been on morphine sulfate IR 15 mg  t.i.d.  She was requesting to trial the Percocet.  She was given 28  pills to trial over 7 days.  She is back in today to review this trial  with me.   She states that on the first day, after the second dose, she felt great.  However, every day after that, was not working as well as immediate-  release morphine sulfate for her.   She states she average pain is about a 3-4 on a scale of 10.  She did  notice some flushing with the Percocet which she felt to be somewhat  uncomfortable for her.   She is getting a little to fair relief with the current medications that  she is on.   MEDICATIONS PRESCRIBED BY THIS CLINIC INCLUDE:  1. Lyrica 75 mg twice a day.  2. Kadian 80 mg twice a day.  3. Percocet 10/325 up to 4 times a day.  4. Morphine sulfate IR was discontinued for approximately 6 days.   No other changes were noted regarding her past medical, social, or  family history since her last visit, which was May 23, 2006.   PHYSICAL EXAMINATION:  Today, her blood pressure is 173/75, pulse 99,  respirations 16, 97% saturated on room air.  She is a well-developed,  well-nourished female who appears her stated age.  She is oriented times  3.  Affect is bright, alert, cooperative, and pleasant.  Speech is  clear.  She follows commands without difficulty.  She transitions from  sitting to standing easily, gait is normal.   IMPRESSION:  1. Breast cancer diagnosed 1997 with metastasis to sacrum and liver.      Currently under the care of Dr. Rolly Salter over at [facility]      in [place].  2. 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  Bone      scan showing some  increased activity in the area of the right      medial clavicle and medial second left rib.  3. Limitation in shoulder range of motion.  Multifactorial in nature.  4. Status post radiation in sacral region with positive bone scan in      right sacral area consistent with metastatic disease.  5. History of coccydynia.   PLAN:  Will refill her morphine sulfate, 15 mg, 1 p.o. t.i.d., #90.  She  was just given a prescription for Kadian.  I will see her back in clinic  on July 26, 2006.  She has currently been stable on these medications.  Has displayed no aberrant behavior.  Uses her medications appropriately  and is able to maintain a functional lifestyle at this time.           ______________________________  Brantley Stage, M.D.    DMK/MedQ  D:  07/05/2006 08:28:05  T:  07/05/2006 08:57:52  Job #:  [id number]

## 2010-06-21 NOTE — Assessment & Plan Note (Signed)
Mariah Grimes is a 65 year old woman who is being followed in our [facility] for chest wall pain and sacral pain related to a  metastatic breast cancer.  She is also followed by Dr. Virginia Rochester, her  oncologist in [place].   She was last seen by me on Jul 05, 2007.  In the interim, she states her  pain has been well controlled.  Her average pain is about 3 on a scale  of 10.  It is located in the right chest wall and in the sacrum, which  is worse when she is sitting.  Her sleep is fair.  She gets fair to good  relief with the pain medicine.  She recently returned back from a trip  to beach.  She has made 2 trips to the beach over the last couple of  months.  She is walking up to 20 minutes at a time now, which is overall  an improvement.  She is able to climb stairs.  She is not driving.  She  is independent with all of her self care including meal prep, needs help  with higher level household task, heavier task.   REVIEW OF SYSTEMS:  Positive for numb feet and occasional rash from the  chemotherapy.   No other changes in the past medical, surgical, or family history.  She  did have a new port placed on July 30, 2007.   MEDICATIONS:  Prescribed from this clinic include,  1. Immediate release morphine sulfate 50 mg 3 times a day p.r.n.  2. Lyrica 75 mg 1 p.o. b.i.d.  3. Kadian 80 mg 1 p.o. b.i.d.  Occasionally, she will take an      ibuprofen as well.   PHYSICAL EXAMINATION:  VITAL SIGNS:  Blood pressure is 144/64, pulse 82,  respirations 18, and 99% saturated on room air.  GENERAL:  She is a well-developed, well-nourished female who appears her  stated age.  She does not appear in any distress.  She is oriented x3.  Speech is clear.  Affect is bright.  She is alert, cooperative, and  pleasant.  She follows commands easily.   Cranial nerves are grossly intact.  Motor strength is 5/5 in both upper  and lower extremities without focal deficits.  Reflexes are  symmetric  and 2+ in the upper extremities, diminished 0 in the lower extremities.  No abnormal tone is noted.  No clonus is noted.  Coordination is grossly  intact.  Tandem gait and Romberg test are all performed adequately.  Her  gait is normal as well.  She is able to arise out of the chair without  any problems.   Musculoskeletal exam reveals mildly diminished range of motion in all  planes and her cervical spine.  She has limited motion in the right  shoulders.  Limited internal and external rotation.  Tenderness over the  right anterior chest wall with palpation.   Palpation of the sinus processes from the thoracic through the lumbar  spine did not reveal any tenderness today.  She does have numbness in  bilateral feet.   IMPRESSION:  1. Resolution of plantar fasciitis.  2. Breast cancer diagnosed in 1997 with metastasis of the sacrum,      liver, and anterior ribs.  Currently under the care of Dr. Virginia Rochester,      undergoing chemotherapy.  She is currently again on Xeloda.  3. Paraesthesia in the hands and feet.  4. 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  Pain typically is worsened with activity.  5. Decreased shoulder range of motion which is multifactorial in      nature.  6. Status post radiation to the right sacrum with a history of      pathologic fracture.  7. Intermittent constipation, which is currently not a problem.   PLAN:  Refilled the following medications for her.  Kadian 80 mg 1 p.o.  b.i.d. #60, morphine sulfate IR 15 mg one p.o. t.i.d. p.r.n. chest wall  and sacrum pain #90.  Next month, she will need a refill on her Lyrica.  We will follow her back up next month either myself or nursing visit.  She has been stable on the above medications.  She does not exhibit any  aberrant behavior.  She will take her medications as prescribed, and she  has been able to maintain a relatively functional lifestyle despite   metastatic breast cancer.           ______________________________  Brantley Stage, M.D.     DMK/MedQ  D:  07/31/2007 13:10:07  T:  08/01/2007 07:52:09  Job #:  [id number]

## 2010-06-24 NOTE — Procedures (Signed)
[facility]  Patient:    Mariah Grimes, Mariah Grimes Visit Number: [id number] MRN: [id number]          Service Type: GON Location: GYN Attending Physician:  Jeannette Corpus Dictated by:   Florencia Reasons, M.D. Proc. Date: 07/10/01 Admit Date:  07/09/2001 Discharge Date: 07/09/2001   CC:         Valentino Hue. Magrinat, M.D.  Daniel L. Clarke-Pearson, M.D.   Procedure Report  PROCEDURE:  Upper endoscopy.  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  The patient had a negative colonoscopy by me approximately four months ago, and endoscopic evaluation has been requested to help exclude an upper tract source of the patients atypical ovarian malignancy.  FINDINGS:  Normal exam.  DESCRIPTION OF PROCEDURE:  The nature, purpose, and risks of the procedure were had been discussed with the patient, who provided written consent. Sedation was fentanyl 50 mcg and Versed 7 mg IV without arrhythmias or desaturation.  The Olympus video endoscope was passed under direct vision. The vocal cords appeared normal.   The esophagus was quite readily entered and was normal in its entirety without any evidence of neoplasia, infection, varices, reflux esophagitis, Barretts esophagus, ring, stricture, or hiatal hernia.  The stomach contained no significant residual and had normal mucosa without evidence of gastritis, erosions, ulcers, polyps, or masses, including a retroflexed view of the cardia of the stomach,  The pylorus, duodenal bulb, and second duodenum looked normal.  The scope was removed from the patient.  She tolerated the procedure well, and there were no apparent complications.  IMPRESSION:  Normal endoscopic evaluation.  No source of ovarian malignancy endoscopically evident.  PLAN:  Follow-up per Dr. Darnelle Catalan. Dictated by:   Florencia Reasons, M.D. Attending  Physician:  Jeannette Corpus DD:  07/10/01 TD:  07/11/01 Job: [id number]

## 2010-06-24 NOTE — Op Note (Signed)
NAME:  Mariah Grimes, Mariah Grimes                          ACCOUNT NO.:  [id number]   MEDICAL RECORD NO.:  [id number]                   PATIENT TYPE:  INP   LOCATION:  2314                                 FACILITY:  [facility]   PHYSICIAN:  Ines Bloomer, M.D.              DATE OF BIRTH:  [birth year]   DATE OF PROCEDURE:  02/16/2003  DATE OF DISCHARGE:                                 OPERATIVE REPORT   PREOPERATIVE DIAGNOSIS:  Metastatic breast cancer, possible metastatic  breast cancer to the right internal mammary nodes.   POSTOPERATIVE DIAGNOSIS:  Metastatic breast cancer, possible metastatic  breast cancer to the right internal mammary nodes.   OPERATION PERFORMED:  Chest wall resection of the right costal margins and  partial sternal resection with reconstruction.   SURGEON:  Ines Bloomer, M.D.   ASSISTANT:  Carmin Muskrat. Eustaquio Boyden.   ANESTHESIA:  General.   DESCRIPTION OF PROCEDURE:  After percutaneous insertion of all monitoring  lines, the patient underwent general anesthesia.  This 65 year old patient  has had breast cancer, ovarian cancer, now had an enlarging mass along the  right internal mammary chain.  There are actually three or four masses seen  but the largest being superiorly at the area of the first and second rib  which was seen to be protruding on the right innominate vein.  Because of  this and it's unresponsive to chemotherapy and had grown slowly over a  period of about 9 to 12 months it was thought that she would be amenable to  chest wall resection.  She had a consultation at [facility] and Dr.  Westly Pam agreed with that possibility as well as the oncologist there.  She also was seen by Dr. Darnelle Catalan.  She was brought to the operating room  and underwent general anesthesia, was prepped and draped in the usual  sterile manner.  A roll was placed underneath the shoulders to elevate it  and the neck was turned to the left side.  A midline median sternotomy  incision was made and carried down with electrocautery to the subcutaneous  tissue and fascia.  The subcutaneous tissue was dissected off on the right  side exposing the pectoral muscles and the pectoral muscles was dissected up  taking it laterally to expose the costal cartilages from the costal margin  up to the clavicle.  However, a portion of the pectoral muscle was left over  the ribs where the larger mass was noted in order to obtain adequate  margins.  The rest of the right pectoral muscle was elevated up off the  chest wall.  After this had been done, the sternum was divided with the saw  leaving one third on the left and two thirds of the sternum on the right.  The subcutaneous tissue was then freed up and attention was turned  superiorly where the right sternocleidomastoid was taken down as well as  sternohyoid muscles.  A Tuffier was placed underneath the area of the right  sternum and opened elevating the right sternum and pressing down the left  sternum.  In fact, two Tuffiers ended up being placed, one superiorly and  one inferiorly.  The large upper mass was stuck to the innominate vein,  dissection was started to take it off the innominate vein.  The right  internal mammary vein was identified and came off the innominate vein.  It  was doubly ligated and divided.  Then by sharp dissection, the vein was  freed up starting where it entered the superior vena cava dissecting  superiorly dividing into the right internal jugular and the right subclavian  vein.  As it was dissected more superiorly, we identified the vagus nerve as  it came down on the right as well as the phrenic nerve that was lateral.  The vagus nerve was medial to the vein and the phrenic nerve was lateral.  These were dissected free and protected and as we got to the apex we could  see the internal mammary artery was identified.  This was doubly ligated  with 2-0 silk and clipped and divided.  This freed the  mass off the vein and  surrounding tissues and then the chest wall, it was decided to do the chest  wall resection.  Inferiorly, the sternum was divided right at the costal  margin and area of the xiphoid process and then the costal margin was fifth  and sixth cartilages were divided and the carried laterally to the fifth  rib.  It was then divided at the fifth rib and then the intercostal muscles  were divided with electrocautery and then the fourth rib was divided at the  costochondral junction and the third rib was divided at the costochondral  junction.  In like manner the second rib was divided at the costochondral  junction.  The head of the clavicle was disarticulated from the  sternoclavicular joint to expose the second rib and this was divided 2 cm  lateral from the mass and the entire chest wall was then removed en bloc  removing the ribs and getting at least 1 to 2 cm margins medially,  laterally, inferiorly and superiorly.  On area that appeared to be just  about 1 cm, a secondary margin was taken, this was between the second and  third ribs in the intercostal muscle.  After this had been done, all areas  electrocoagulated.  Two chest tubes were brought in inferiorly, one to the  left side of the incision and one inferiorly to the right breast and putting  one in the right chest and one in the mediastinum.  Then using #5 wire, the  first, second, third, fourth and fifth ribs were sutured with sternal wires  and then inferiorly to the costal cartilages, #2 Prolene was placed.  On the  right side, five or six sternal wires were placed around the sternum  superiorly two holes were drilled, one through the clavicle head and one  medially to this for two more sternal wires.  After this had been placed, a  Prolene mesh was measured to cover this area and then methyl methacrylate  was cured and placed between the Prolene mesh with Prolene mesh being superiorly and inferiorly.  It was  allowed to dry.  The methyl methacrylate  was approximately 4 to 5 mm thick.  It was approximately 6 cm in rectangular  shape and 6 cm in  width and 14 cm in length.  Then using a drill, multiple  drill holes were placed through the mesh and the methyl methacrylate and the  sternal wires were threaded through these drill holes and the mesh was  placed into the chest.  The mesh and the methyl methacrylate prosthesis was  placed in the chest and the wires were twisted and cut and turned over to  fix the clavicle, the right ribs, the left sternum, the remainder of the  left sternum and the right inferior costal margin to the prosthesis.  After  this had been done, the rectus muscle inferiorly was closed with interrupted  #1 Vicryl and then the sternocleidomastoid muscle and sternohyoid muscle  superiorly were closed.  The pectoral muscle flap was then sutured to the  midline over the prosthesis.  It covered about 80% of the prosthesis but not  an area where we had to resect the pectoral muscle.  There was approximately  a 2 x 3 cm area there and on top of the muscle flap was placed brought in  two Jackson-Pratt and they were sutured in place with 2-0 silk.  In then the  subcutaneous tissue was sutured together with interrupted 2-0 Vicryl and the  skin was closed with 3-0 Vicryl subcuticular stitch and Dermabond for the  skin.  The patient tolerated the procedure well, was kept intubated and  returned to the intensive care unit where she would be allowed to slowly  wake up for extubation.                                               Ines Bloomer, M.D.    DPB/MEDQ  D:  02/16/2003  T:  02/17/2003  Job:  [id number]   cc:   Valentino Hue. Magrinat, M.D.  [address] [facility]  [place]  [place] [address]  Fax: [phone]

## 2010-06-24 NOTE — Procedures (Signed)
NAME:  [name], [name]                ACCOUNT NO.:  [id number]   MEDICAL RECORD NO.:  [id number]          PATIENT TYPE:  REC   LOCATION:  TPC                          FACILITY:  [facility]   PHYSICIAN:  Erick Colace, M.D.DATE OF BIRTH:  [birth year]   DATE OF PROCEDURE:  DATE OF DISCHARGE:                                 OPERATIVE REPORT   MEDICAL RECORD NUMBER:  [id number]   DATE OF BIRTH:  [birth year]   PROCEDURE:  Acupuncture treatment.   ATTENDING:  Erick Colace, M.D.   INDICATION:  Average pain 3-4, chest, right side greater than left side.   DESCRIPTION OF PROCEDURE:  Treatment today consisted of points placed at BL-  41, BL-42, BL-43, BL-44 and BL-45 on the right side connected to SP-17, -18,  -19, -20 and -21.   Electrical stimulation at 2 Hz x20 minutes.  The patient tolerated the  procedure well.      Erick Colace, M.D.  Electronically Signed     AEK/MEDQ  D:  05/26/2005 14:04:45  T:  05/29/2005 21:30:86  Job:  [id number]

## 2010-06-24 NOTE — Group Therapy Note (Signed)
HISTORY:  A 65 year old female with breast cancer status post chest wall  resection and chest wall prosthesis placement on the right, according to x-  rays spanning from T2 to T8 at the mid clavicular line to the sternum on the  right side.  This surgery was done February 16, 2003, per Dr. Dewayne Shorter. The  right pectoral muscle was partially spared.  The op note had areas between  the second and fifth ribs incised and covered in the area where pectoral  muscle was resected.   She has had chronic pain in that area, actually bilateral upper chest;  however, more on the right side.  When she has times of increased pain, she  notices that her upper back hurts, particularly on the right side.  She has  no numbness going into the arms.  She has no neck pain, no numbness in the  legs or pain in those areas.   She has been on narcotic analgesics with only partial improvement.  She  states that using the 10-point rating scale is pretty hard for her, and she  rates her pain at around a 5/10 but does not really know what a 10 means.   PAST MEDICAL HISTORY:  Also significant for:  1.  Asthma.  2.  Hypertension.  3.  Left frozen shoulder.  4.  History of ovarian tumor status post hysterectomy.   SOCIAL HISTORY:  She is married, has 3 children.  She is soon to be changing  her medical insurance.   PHYSICAL EXAMINATION:  GENERAL: No acute distress.  Mood and affect  appropriate.  MUSCULOSKELETAL:  Her right upper extremity lacks 40 degrees of abduction.  She has good strength in the biceps, triceps, and flexors but 3- in the  right deltoid due to pain. She has good neck range of motion without pain.  She has normal deep tendon reflexes.  She has normal sensation bilateral  upper extremities.  She has minimal tenderness to palpation in the  parasternal area on the right side or the left side.   IMPRESSION:  Chronic right greater than left-sided chest wall pain following  partial resection of  pectoralis as well as resection of right costal margin  as well as partial sternal resection with reconstruction.   RECOMMENDATIONS:  I think she could benefit from acupuncture therapy;  however, that is one component of her overall pain management program.  I  instructed her of need for weekly visits x6 for proper assessment of pain  relief and the nature of the treatments as well as overall duration and  expected benefit.  She will discuss with her husband and decide on whether  or not she wants to schedule some visits.      Erick Colace, M.D.  Electronically Signed     AEK/MedQ  D:  05/23/2005 10:54:27  T:  05/23/2005 22:50:17  Job #:  [id number]   cc:   Valentino Hue. Magrinat, M.D.  Fax: [phone]   Brantley Stage, M.D.  Fax: [phone]   Ines Bloomer, M.D.  [address]  [place]  [place] [address]

## 2010-10-28 LAB — LIPASE, BLOOD: Lipase: 16

## 2010-10-28 LAB — URINALYSIS, ROUTINE W REFLEX MICROSCOPIC
Nitrite: NEGATIVE
Specific Gravity, Urine: 1.025
Urobilinogen, UA: 1
pH: 8

## 2010-10-28 LAB — CBC
HCT: 29.8 — ABNORMAL LOW
Hemoglobin: 10.4 — ABNORMAL LOW
Hemoglobin: 10.4 — ABNORMAL LOW
Hemoglobin: 11.1 — ABNORMAL LOW
Hemoglobin: 13.8
MCHC: 33.6
MCHC: 34.2
MCV: 93.8
MCV: 94.9
MCV: 95.2
MCV: 95.3
Platelets: 175
RBC: 3.12 — ABNORMAL LOW
RBC: 3.13 — ABNORMAL LOW
RBC: 3.4 — ABNORMAL LOW
RDW: 21.8 — ABNORMAL HIGH
RDW: 23.9 — ABNORMAL HIGH
WBC: 3.8 — ABNORMAL LOW
WBC: 5.7

## 2010-10-28 LAB — DIFFERENTIAL
Basophils Relative: 0
Eosinophils Relative: 0
Monocytes Absolute: 0.8
Monocytes Relative: 14 — ABNORMAL HIGH
Neutrophils Relative %: 66

## 2010-10-28 LAB — BASIC METABOLIC PANEL
BUN: 7
CO2: 24
CO2: 25
Calcium: 9.7
Chloride: 105
Chloride: 108
Chloride: 111
Creatinine, Ser: 0.56
GFR calc Af Amer: >60
GFR calc Af Amer: >60
GFR calc Af Amer: >60
GFR calc Af Amer: >60
GFR calc non Af Amer: >60
GFR calc non Af Amer: >60
Glucose, Bld: 101 — ABNORMAL HIGH
Glucose, Bld: 107 — ABNORMAL HIGH
Potassium: 3 — ABNORMAL LOW
Potassium: 3.6
Sodium: 136
Sodium: 138
Sodium: 139
Sodium: 140

## 2010-10-28 LAB — COMPREHENSIVE METABOLIC PANEL
ALT: 21
CO2: 35 — ABNORMAL HIGH
Calcium: 10.6 — ABNORMAL HIGH
Creatinine, Ser: 0.72
GFR calc Af Amer: >60
GFR calc non Af Amer: >60
Glucose, Bld: 113 — ABNORMAL HIGH
Sodium: 132 — ABNORMAL LOW
Total Protein: 7.7

## 2010-10-28 LAB — URINE MICROSCOPIC-ADD ON

## 2010-10-28 LAB — APTT: aPTT: 29

## 2011-05-24 DIAGNOSIS — M479 Spondylosis, unspecified: Secondary | ICD-10-CM | POA: Insufficient documentation

## 2011-06-12 ENCOUNTER — Ambulatory Visit (INDEPENDENT_AMBULATORY_CARE_PROVIDER_SITE_OTHER): Payer: Medicare Other | Admitting: Surgery

## 2011-06-12 ENCOUNTER — Other Ambulatory Visit (INDEPENDENT_AMBULATORY_CARE_PROVIDER_SITE_OTHER): Payer: Self-pay | Admitting: Surgery

## 2011-06-12 ENCOUNTER — Encounter (INDEPENDENT_AMBULATORY_CARE_PROVIDER_SITE_OTHER): Payer: Self-pay | Admitting: Surgery

## 2011-06-12 VITALS — BP 116/80 | HR 80 | Temp 98.0°F | Resp 14 | Ht 62.0 in | Wt 143.0 lb

## 2011-06-12 DIAGNOSIS — L02219 Cutaneous abscess of trunk, unspecified: Secondary | ICD-10-CM

## 2011-06-12 DIAGNOSIS — L02213 Cutaneous abscess of chest wall: Secondary | ICD-10-CM | POA: Insufficient documentation

## 2011-06-12 DIAGNOSIS — C50919 Malignant neoplasm of unspecified site of unspecified female breast: Secondary | ICD-10-CM | POA: Insufficient documentation

## 2011-06-12 MED ORDER — AMOXICILLIN-POT CLAVULANATE 875-125 MG PO TABS: 1.0000 | ORAL_TABLET | Freq: Two times a day (BID) | ORAL | Status: AC

## 2011-06-12 NOTE — Patient Instructions (Signed)
See your surgeon at [facility] to evaluate the SQ mass/probable abscess & compare to your recent films.  Consider incision & drainage of the mass by your surgeon if it does not open up on its own Abscess An abscess (boil or furuncle) is an infected area that contains a collection of pus.  SYMPTOMS Signs and symptoms of an abscess include pain, tenderness, redness, or hardness. You may feel a moveable soft area under your skin. An abscess can occur anywhere in the body.  TREATMENT  A surgical cut (incision) may be made over your abscess to drain the pus. Gauze may be packed into the space or a drain may be looped through the abscess cavity (pocket). This provides a drain that will allow the cavity to heal from the inside outwards. The abscess may be painful for a few days, but should feel much better if it was drained.  Your abscess, if seen early, may not have localized and may not have been drained. If not, another appointment may be required if it does not get better on its own or with medications. HOME CARE INSTRUCTIONS   Only take over-the-counter or prescription medicines for pain, discomfort, or fever as directed by your caregiver.   Take your antibiotics as directed if they were prescribed. Finish them even if you start to feel better.   Keep the skin and clothes clean around your abscess.   If the abscess was drained, you will need to use gauze dressing to collect any draining pus. Dressings will typically need to be changed 3 or more times a day.   The infection may spread by skin contact with others. Avoid skin contact as much as possible.   Practice good hygiene. This includes regular hand washing, cover any draining skin lesions, and do not share personal care items.   If you participate in sports, do not share athletic equipment, towels, whirlpools, or personal care items. Shower after every practice or tournament.   If a draining area cannot be adequately covered:   Do not  participate in sports.   Children should not participate in day care until the wound has healed or drainage stops.   If your caregiver has given you a follow-up appointment, it is very important to keep that appointment. Not keeping the appointment could result in a much worse infection, chronic or permanent injury, pain, and disability. If there is any problem keeping the appointment, you must call back to this facility for assistance.  SEEK MEDICAL CARE IF:   You develop increased pain, swelling, redness, drainage, or bleeding in the wound site.   You develop signs of generalized infection including muscle aches, chills, fever, or a general ill feeling.   You have an oral temperature above 102 F (38.9 C).  MAKE SURE YOU:   Understand these instructions.   Will watch your condition.   Will get help right away if you are not doing well or get worse.  Document Released: 11/02/2004 Document Revised: 01/12/2011 Document Reviewed: 08/27/2007 [facility] Patient Information 2012 [place], [place].

## 2011-06-12 NOTE — Progress Notes (Signed)
Subjective:     Patient ID: Mariah Grimes, female   DOB: [birth year], 66 y.o.   MRN: [id number]  HPI  Mariah Grimes  Jul 02, 1945 [id number]  Patient Care Team: Sissy Hoff, MD as PCP - General (Family Medicine) Lennie Odor as Physician Assistant (Internal Medicine)  This patient is a 66 y.o.female who presents today for surgical evaluation at the request of Dr. Azucena Cecil.   Reason for visit: Chest wall abscess.  Patient is a breast cancer survivor. She had all her surgery and treatment done at [facility]. She had not mainly done in 2005. I walked in the room, she showed me a PET scan and CT scans talking but a fluid collection anterior to her sternum. He also noted she had sternal reconstruction.  She notes 6 days ago she noticed some swelling in the middle of her sternal incision. It is tender to touch. It is not draining. A palpable bit larger. No falls or fevers or chills. No history of MRSA. She's never had problems with wound her pain issues that she can recall. She was concerned. She mentioned that her primary care physician. Recommend seeing [place] to see if something needed to be drained. She notes since that time she has set up an appointment to see her oncologist tomorrow. She is wondering if she should followup with instead.  No fevers or chills. No sick contacts. Weight has been okay.  Patient Active Problem List  Diagnoses  . Breast cancer  . Cutaneous abscess mid-sternum s/p sternal reconstruction for breast cancer    Past Medical History  Diagnosis Date  . Asthma   . Breast cancer     Breast Right  . Hypertension     Past Surgical History  Procedure Date  . Combined hysterectomy abdominal w/ a&p repair / oophorectomy 2005  . Resection sternum and ribs 2003  . Biopsy of right breast lymph nodes 1997    History   Social History  . Marital Status: Married    Spouse Name: N/A    Number of Children: N/A  . Years of Education: N/A   Occupational History   . Not on file.   Social History Main Topics  . Smoking status: Not on file  . Smokeless tobacco: Not on file  . Alcohol Use: No  . Drug Use: No  . Sexually Active:    Other Topics Concern  . Not on file   Social History Narrative  . No narrative on file    Family History  Problem Relation Age of Onset  . Cancer Mother     Breast  . Cancer Father     prostate  . Cancer Maternal Aunt     breast  . Cancer Maternal Grandmother     breast  . Cancer Paternal Grandfather     lung    Current Outpatient Prescriptions  Medication Sig Dispense Refill  . calcium-vitamin D (OSCAL WITH D) 250-125 MG-UNIT per tablet Take 1 tablet by mouth daily.      Jennette Banker Sodium 30-100 MG CAPS Take by mouth.      . irbesartan (AVAPRO) 300 MG tablet Take 300 mg by mouth at bedtime.      Marland Kitchen LORazepam (ATIVAN) 1 MG tablet Take 1 mg by mouth every 8 (eight) hours.      Marland Kitchen morphine (KADIAN) 80 MG 24 hr capsule Take 80 mg by mouth 2 (two) times daily before a meal.      . morphine (MSIR)  15 MG tablet Take 15 mg by mouth every 4 (four) hours as needed.      Marland Kitchen omeprazole (PRILOSEC) 10 MG capsule Take 10 mg by mouth daily.      . pregabalin (LYRICA) 200 MG capsule Take 100 mg by mouth 2 (two) times daily.      . Zoledronic Acid (ZOMETA IV) Inject into the vein every 30 (thirty) days.      Marland Kitchen amoxicillin-clavulanate (AUGMENTIN) 875-125 MG per tablet Take 1 tablet by mouth 2 (two) times daily.  14 tablet  3     Not on File  BP 116/80  Pulse 80  Temp(Src) 98 F (36.7 C) (Temporal)  Resp 14  Ht 5\' 2"  (1.575 m)  Wt 143 lb (64.864 kg)  BMI 26.15 kg/m2     Review of Systems  Constitutional: Negative for fever, chills and diaphoresis.  HENT: Negative for ear pain, sore throat and trouble swallowing.   Eyes: Negative for photophobia and visual disturbance.  Respiratory: Negative for cough and choking.   Cardiovascular: Negative for chest pain and palpitations.  Gastrointestinal:  Negative for nausea, vomiting, abdominal pain, diarrhea, constipation, anal bleeding and rectal pain.  Genitourinary: Negative for dysuria, frequency and difficulty urinating.  Musculoskeletal: Negative for myalgias and gait problem.  Skin: Negative for color change, pallor and rash.  Neurological: Negative for dizziness, speech difficulty, weakness and numbness.  Hematological: Negative for adenopathy.  Psychiatric/Behavioral: Negative for confusion and agitation. The patient is not nervous/anxious.        Objective:   Physical Exam  Constitutional: She is oriented to person, place, and time. She appears well-developed and well-nourished. No distress.  HENT:  Head: Normocephalic.  Mouth/Throat: Oropharynx is clear and moist. No oropharyngeal exudate.  Eyes: Conjunctivae and EOM are normal. Pupils are equal, round, and reactive to light. No scleral icterus.  Neck: Normal range of motion. No tracheal deviation present.  Cardiovascular: Normal rate and intact distal pulses.   Pulmonary/Chest: Effort normal. No respiratory distress. She exhibits no tenderness.    Abdominal: Soft. She exhibits no distension. There is no tenderness. Hernia confirmed negative in the right inguinal area and confirmed negative in the left inguinal area.       Incisions clean with normal healing ridges.  No hernias  Genitourinary: No vaginal discharge found.  Musculoskeletal: Normal range of motion. She exhibits no tenderness.  Lymphadenopathy:       Right: No inguinal adenopathy present.       Left: No inguinal adenopathy present.  Neurological: She is alert and oriented to person, place, and time. No cranial nerve deficit. She exhibits normal muscle tone. Coordination normal.  Skin: Skin is warm and dry. No rash noted. She is not diaphoretic.  Psychiatric: She has a normal mood and affect. Her behavior is normal.   She shows me reports her weight fours. PET scan from January notes a fluid collection  anterior to the sternum. No dimensions noted. A CT of the chest abdomen and pelvis done last month shows a fluid collection. Dimensions I do not see noted.    Assessment:     Fluid collection in the middle of the sternal incision. Probable subcutaneous abscess. Question of sternal infection or cancer recurrence in the setting of her distant breast cancer history with sternal reconstruction she cannot fully explain nor I do not have any available documentation of.    Plan:     Beatriz Chancellor says it is probably needs to be drained at some point. While it  it off for for her to have this done, I noted and might be better to have the oncologist and surgeon to have managed her breast cancer issues see her and said. I don't know when I opened out what's going to be at the base. Just simple as a skin abscess but with the sternal reconstruction that gives me pause.  I did leave several options for her including and draining it right now. She likes idea of seeing her medical oncologist first. I stressed to her that she needs to be evaluated by surgeon sometime this week as I suspect that it will require to be unroofed. It may spontaneously drain on its own. He does not seem larger nor severe so immediate drainage is not needed since it is 6 days out.  I did offer to start her on some antibiotics just to cover bases until she can be seen. We'll prescribe  Augmentin since she does not have any definite history of MRSA that she can recall.

## 2011-06-15 ENCOUNTER — Ambulatory Visit (INDEPENDENT_AMBULATORY_CARE_PROVIDER_SITE_OTHER): Payer: Medicare Other | Admitting: General Surgery

## 2011-06-15 VITALS — BP 126/82 | HR 68 | Temp 97.7°F | Resp 16 | Ht 62.0 in | Wt 138.8 lb

## 2011-06-15 DIAGNOSIS — IMO0002 Reserved for concepts with insufficient information to code with codable children: Secondary | ICD-10-CM

## 2011-06-15 DIAGNOSIS — L089 Local infection of the skin and subcutaneous tissue, unspecified: Secondary | ICD-10-CM | POA: Insufficient documentation

## 2011-06-15 MED ORDER — ONDANSETRON HCL 4 MG PO TABS: 4.0000 mg | ORAL_TABLET | Freq: Three times a day (TID) | ORAL | Status: AC | PRN

## 2011-06-15 NOTE — Progress Notes (Signed)
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 Karle Plumber. Over the past week she's been applied by a boil on her chest which was aspirated by resident at [facility] and is seen by Dr. gross in our office. He offered to drain this abscess however the patient felt as though is better for her to see her oncologist [facility] prior to having it done.  She has a past history of having had an MRI scan and a CT scan of her chest demonstrating fluid accumulation in the periprosthetic area of her chest.  PE The patient has 2 small openings in the upper chest area which are draining pus. I was able to probe this area with a Q-tip after cleansing the area with antiseptic solution. Approximately 3-4 cc of mucopurulent fluid was drained. She's had previous cultures sent at an outside institution. Her current antibiotic therapy includes Augmentin which is given to her by Dr. Michaell Cowing on Monday. The Augmentin does cause her nausea however.   The patient also has a slightly raised and erythematous area in the superior sternal area. There is no fluctuance in this area however it is tender.  Studiy review Currently there are no studies to review however I expect that she should get sometime scan in the near future.  Assessment Infected chest area possibly associated with good accumulation around the para-prosthetic area.  Plan Like to refer the patient back to Dr. Edwyna Shell or a thoracic surgeon who understands what she's had done extensively in the past. She should remain on her Augmentin therapy. We will give Dr. Scheryl Darter office call tomorrow as his office is likely close currently.

## 2011-06-15 NOTE — Progress Notes (Signed)
Addended by: Lonia Farber on: 06/15/2011 05:45 PM   Modules accepted: Orders

## 2011-06-16 ENCOUNTER — Telehealth (INDEPENDENT_AMBULATORY_CARE_PROVIDER_SITE_OTHER): Payer: Self-pay | Admitting: General Surgery

## 2011-06-16 NOTE — Telephone Encounter (Signed)
Patient called with culture report from [facility] letting us know it showed no growth. Wanted to call us to let us know and see if we need to change her antibiotic. Made her aware if this showed no growth it was going to give [place] no information about antibiotics. I advised to stay on Augmentin and follow up with Dr Edwyna Shell per Dr Dixon Boos note. She will call as needed. She is sending culture report.

## 2011-06-19 ENCOUNTER — Other Ambulatory Visit: Payer: Self-pay | Admitting: Thoracic Surgery

## 2011-06-19 ENCOUNTER — Telehealth (INDEPENDENT_AMBULATORY_CARE_PROVIDER_SITE_OTHER): Payer: Self-pay | Admitting: General Surgery

## 2011-06-19 DIAGNOSIS — T888XXA Other specified complications of surgical and medical care, not elsewhere classified, initial encounter: Secondary | ICD-10-CM

## 2011-06-19 DIAGNOSIS — Z853 Personal history of malignant neoplasm of breast: Secondary | ICD-10-CM

## 2011-06-19 NOTE — Telephone Encounter (Signed)
Called pt for Dr. Lindie Spruce, to let her know that Dr. Scheryl Darter office would be calling her this week to set up appt.  Also to renew her Rx for Augment; she states Dr. Michaell Cowing had already written for that.

## 2011-06-21 ENCOUNTER — Other Ambulatory Visit: Payer: Self-pay

## 2011-06-22 ENCOUNTER — Encounter: Payer: Self-pay | Admitting: Thoracic Surgery

## 2011-06-22 ENCOUNTER — Ambulatory Visit (INDEPENDENT_AMBULATORY_CARE_PROVIDER_SITE_OTHER): Payer: Self-pay | Admitting: Thoracic Surgery

## 2011-06-22 ENCOUNTER — Other Ambulatory Visit: Payer: Self-pay

## 2011-06-22 VITALS — BP 142/85 | HR 93 | Resp 16 | Ht 62.0 in | Wt 140.0 lb

## 2011-06-22 DIAGNOSIS — C413 Malignant neoplasm of ribs, sternum and clavicle: Secondary | ICD-10-CM

## 2011-06-22 NOTE — Progress Notes (Signed)
HPIThis 66 year old Caucasian female underwent a chest wall resection and reconstruction 2005 for metastatic breast cancer she is doing well since then. Approximately 3 weeks condition notices like structure and her sternal incision this was cultured at [facility] and was no growth started draining repeat CT scan of CT scan done on 06/02/2011 showed he is 2 cm cystic collection near a sternal wire. She was placed on the omentum. She saw the general surgeons. A repeat CT scan done yesterday at [facility] showed that the correction has resolved and he is one small area of air. There is no erythema no tenderness and no more drainage we recommended that he can continue the antibiotics until completion and we will see her back again in a week to reexamine the wound. She may need a local exploration and removal of sternal wire.  Current Outpatient Prescriptions  Medication Sig Dispense Refill  . amoxicillin-clavulanate (AUGMENTIN) 875-125 MG per tablet Take 1 tablet by mouth 2 (two) times daily.  14 tablet  3  . calcium-vitamin D (OSCAL WITH D) 250-125 MG-UNIT per tablet Take 1 tablet by mouth daily.      Jennette Banker Sodium 30-100 MG CAPS Take by mouth.      . irbesartan (AVAPRO) 300 MG tablet Take 300 mg by mouth at bedtime.      . lactobacillus acidophilus (BACID) TABS Take 2 tablets by mouth 3 (three) times daily.      Marland Kitchen letrozole (FEMARA) 2.5 MG tablet Take 2.5 mg by mouth daily.      Marland Kitchen LORazepam (ATIVAN) 1 MG tablet Take 1 mg by mouth every 8 (eight) hours.      Marland Kitchen morphine (KADIAN) 80 MG 24 hr capsule Take 80 mg by mouth 2 (two) times daily before a meal.      . morphine (MSIR) 15 MG tablet Take 15 mg by mouth every 4 (four) hours as needed. Takes once daily      . multivitamin (THERAGRAN) per tablet Take 1 tablet by mouth once a week.      Marland Kitchen omeprazole (PRILOSEC) 10 MG capsule Take 10 mg by mouth daily.      . ondansetron (ZOFRAN) 4 MG tablet Take 1 tablet (4 mg total) by mouth every 8  (eight) hours as needed for nausea.  20 tablet  0  . pregabalin (LYRICA) 200 MG capsule Take 100 mg by mouth 2 (two) times daily.      . Zoledronic Acid (ZOMETA IV) Inject into the vein every 30 (thirty) days.         Review of Systems: Unchanged  Physical Exam lungs are clear to auscultation percussion midline median sternotomy incision has a small area drainage no erythema Diagnostic Tests: Culture at [facility] is negative   Impression: Draining area sternal incision rule out infection rule out secondary to sternal wire   Plan: Followup in one week

## 2011-06-28 ENCOUNTER — Ambulatory Visit (INDEPENDENT_AMBULATORY_CARE_PROVIDER_SITE_OTHER): Payer: Medicare Other | Admitting: Thoracic Surgery

## 2011-06-28 ENCOUNTER — Encounter: Payer: Self-pay | Admitting: Thoracic Surgery

## 2011-06-28 VITALS — BP 148/91 | HR 82 | Resp 20 | Ht 62.0 in | Wt 140.0 lb

## 2011-06-28 DIAGNOSIS — L02219 Cutaneous abscess of trunk, unspecified: Secondary | ICD-10-CM

## 2011-06-28 DIAGNOSIS — C413 Malignant neoplasm of ribs, sternum and clavicle: Secondary | ICD-10-CM

## 2011-06-28 DIAGNOSIS — L03319 Cellulitis of trunk, unspecified: Secondary | ICD-10-CM

## 2011-06-28 NOTE — Progress Notes (Signed)
HPI patient returns today. The chest incision is well healed. Clear drainage shows no erythema or no fluctuance. We will continue to follow it and see her back again in 4 weeks to reexamine this area. Dr. Tyrone Sage will follow her after June 30 Current Outpatient Prescriptions  Medication Sig Dispense Refill  . Casanthranol-Docusate Sodium 30-100 MG CAPS Take by mouth.      . irbesartan (AVAPRO) 300 MG tablet Take 300 mg by mouth at bedtime.      . lactobacillus acidophilus (BACID) TABS Take 2 tablets by mouth 1 day or 1 dose.       . letrozole (FEMARA) 2.5 MG tablet Take 2.5 mg by mouth daily.      Marland Kitchen LORazepam (ATIVAN) 1 MG tablet Take 1 mg by mouth every 8 (eight) hours.      Marland Kitchen morphine (KADIAN) 80 MG 24 hr capsule Take 80 mg by mouth 2 (two) times daily.       Marland Kitchen morphine (MSIR) 15 MG tablet Take 15 mg by mouth 1 day or 1 dose. Takes once daily      . multivitamin (THERAGRAN) per tablet Take 1 tablet by mouth once a week.      Marland Kitchen omeprazole (PRILOSEC) 10 MG capsule Take 10 mg by mouth daily.      . pregabalin (LYRICA) 200 MG capsule Take 100 mg by mouth 2 (two) times daily.      . Zoledronic Acid (ZOMETA IV) Inject into the vein every 30 (thirty) days.      . calcium-vitamin D (OSCAL WITH D) 250-125 MG-UNIT per tablet Take 1 tablet by mouth daily.         Review of Systems: Unchanged complains of some increased gas pains after taken in about   Physical Exam lungs are clear auscultation and percussion   Diagnostic Tests: None  Impression: Status post chest wall resection and drainage with negative cultures now healing   Plan: Return in 4 weeks with chest x-ray

## 2011-07-18 ENCOUNTER — Encounter (INDEPENDENT_AMBULATORY_CARE_PROVIDER_SITE_OTHER): Payer: Self-pay

## 2011-07-26 ENCOUNTER — Encounter: Payer: Self-pay | Admitting: Thoracic Surgery

## 2011-07-26 ENCOUNTER — Ambulatory Visit (INDEPENDENT_AMBULATORY_CARE_PROVIDER_SITE_OTHER): Payer: Medicare Other | Admitting: Thoracic Surgery

## 2011-07-26 VITALS — BP 130/78 | HR 74 | Resp 16 | Ht 62.0 in | Wt 140.0 lb

## 2011-07-26 DIAGNOSIS — L02213 Cutaneous abscess of chest wall: Secondary | ICD-10-CM

## 2011-07-26 DIAGNOSIS — Z09 Encounter for follow-up examination after completed treatment for conditions other than malignant neoplasm: Secondary | ICD-10-CM

## 2011-07-26 DIAGNOSIS — L02219 Cutaneous abscess of trunk, unspecified: Secondary | ICD-10-CM

## 2011-07-26 NOTE — Progress Notes (Signed)
HPI the patient returns for followup today. Incision is well-healed there is no evidence of recurrence infection. The prosthesis is stable. She is doing well overall. If she has any more problems she will see Dr. Tyrone Sage.   Current Outpatient Prescriptions  Medication Sig Dispense Refill  . calcium-vitamin D (OSCAL WITH D) 250-125 MG-UNIT per tablet Take 1 tablet by mouth daily.      Jennette Banker Sodium 30-100 MG CAPS Take by mouth.      . irbesartan (AVAPRO) 300 MG tablet Take 300 mg by mouth at bedtime.      . lactobacillus acidophilus (BACID) TABS Take 2 tablets by mouth 1 day or 1 dose.       . letrozole (FEMARA) 2.5 MG tablet Take 2.5 mg by mouth daily.      Marland Kitchen LORazepam (ATIVAN) 1 MG tablet Take 1 mg by mouth every 8 (eight) hours.      Marland Kitchen morphine (KADIAN) 80 MG 24 hr capsule Take 80 mg by mouth 2 (two) times daily.       Marland Kitchen morphine (MSIR) 15 MG tablet Take 15 mg by mouth 1 day or 1 dose. Takes once daily      . multivitamin (THERAGRAN) per tablet Take 1 tablet by mouth once a week.      Marland Kitchen omeprazole (PRILOSEC) 10 MG capsule Take 10 mg by mouth daily.      . pregabalin (LYRICA) 200 MG capsule Take 100 mg by mouth 2 (two) times daily.      . Zoledronic Acid (ZOMETA IV) Inject into the vein every 30 (thirty) days.         Review of Systems: Unchanged  Physical Exam well-healed median sternotomy incision. No evidence of recurrence drainage.   Diagnostic Tests: None   Impression: Status post resection of a metastatic breast cancer with chest wall reconstruction   Plan: Return as needed

## 2011-08-22 DIAGNOSIS — Z0271 Encounter for disability determination: Secondary | ICD-10-CM

## 2011-09-01 ENCOUNTER — Encounter: Payer: Self-pay | Admitting: Cardiothoracic Surgery

## 2011-09-01 ENCOUNTER — Ambulatory Visit (INDEPENDENT_AMBULATORY_CARE_PROVIDER_SITE_OTHER): Payer: Medicare Other | Admitting: Cardiothoracic Surgery

## 2011-09-01 VITALS — BP 131/77 | HR 88 | Temp 99.2°F | Resp 18 | Ht 62.0 in | Wt 140.0 lb

## 2011-09-01 DIAGNOSIS — L03319 Cellulitis of trunk, unspecified: Secondary | ICD-10-CM

## 2011-09-01 DIAGNOSIS — C801 Malignant (primary) neoplasm, unspecified: Secondary | ICD-10-CM

## 2011-09-01 DIAGNOSIS — C7951 Secondary malignant neoplasm of bone: Secondary | ICD-10-CM

## 2011-09-01 DIAGNOSIS — C7952 Secondary malignant neoplasm of bone marrow: Secondary | ICD-10-CM

## 2011-09-01 DIAGNOSIS — C50919 Malignant neoplasm of unspecified site of unspecified female breast: Secondary | ICD-10-CM

## 2011-09-01 DIAGNOSIS — L02219 Cutaneous abscess of trunk, unspecified: Secondary | ICD-10-CM

## 2011-09-01 NOTE — Progress Notes (Signed)
PCP is Sissy Hoff, MD Referring Provider is Sissy Hoff, MD                         [address]            [place] [address]          [phone]      Chief Complaint  Patient presents with  . Wound Check    chest wall incision red, draining with knot x 4days S/P resection of metastatic breast cancer with chest wall reconstruction     HPI: 66 year old Caucasian female with history of right breast cancer metastatic to bone had a extensive chest wall resection with sternal resection and reconstruction using Prolene mesh and methyl methacrylate 7 years ago by Dr. Edwyna Shell. She returns now with redness and localized infection at the upper mid sternal area. This is a recurrent process. The same thing happened earlier this spring and she was evaluated with a CT scan which showed some minimal fluid around one of the wires used to reconstruct her chest and she was treated with a course of oral amoxicillin with resolution. She is followed as an outpatient by Dr. Edwyna Shell who last examined the patient in mid June. The patient now returns with recurrent redness and swelling in the same area. She has not been on antibiotics. She denies fever. She was evaluated previously by the Gen. surgery group here in town as well as by her oncologist at [facility]. An aspirate done at that time showed no growth.  She states she has breast cancer which is spread to her bones. Her last CT scan earlier this [facility] in early July showed some suspicious areas in the liver but the scan of the chest was fairly unremarkable by report. The images are not available for review.  She presents now after calling 48 hours ago to be seen.  Past Medical History  Diagnosis Date  . Asthma   . Breast cancer     Breast Right  . Hypertension     Past Surgical History  Procedure Date  . Combined hysterectomy abdominal w/ a&p repair / oophorectomy 2005  . Resection sternum and ribs 2003  . Biopsy  of right breast lymph nodes 1997    Family History  Problem Relation Age of Onset  . Cancer Mother     Breast  . Cancer Father     prostate  . Cancer Maternal Aunt     breast  . Cancer Maternal Grandmother     breast  . Cancer Paternal Grandfather     lung    Social History History  Substance Use Topics  . Smoking status: Never Smoker   . Smokeless tobacco: Never Used  . Alcohol Use: No    Current Outpatient Prescriptions  Medication Sig Dispense Refill  . Calcium Carb-Cholecalciferol (CALTRATE 600+D) 600-800 MG-UNIT TABS Take by mouth 2 (two) times daily.      Jennette Banker Sodium 30-100 MG CAPS Take by mouth. Takes 2      . irbesartan (AVAPRO) 300 MG tablet Take 300 mg by mouth at bedtime.      . lactobacillus acidophilus (BACID) TABS Take 2 tablets by mouth 1 day or 1 dose.       . letrozole (FEMARA) 2.5 MG tablet Take 2.5 mg by mouth daily.      Marland Kitchen LORazepam (ATIVAN) 1 MG tablet Take 1 mg by mouth at bedtime.       Marland Kitchen  morphine (KADIAN) 80 MG 24 hr capsule Take 80 mg by mouth 2 (two) times daily.       Marland Kitchen morphine (MSIR) 15 MG tablet Take 15 mg by mouth 1 day or 1 dose. Takes once daily      . multivitamin (THERAGRAN) per tablet Take 1 tablet by mouth once a week.      Marland Kitchen omeprazole (PRILOSEC) 10 MG capsule Take 10 mg by mouth daily.      . pregabalin (LYRICA) 200 MG capsule Take 100 mg by mouth 2 (two) times daily.      . Zoledronic Acid (ZOMETA IV) Inject into the vein every 30 (thirty) days.        Allergies  Allergen Reactions  . Keflex (Cephalexin) Other (See Comments)    Pt does not remember     Review of Systems No fever no fever weight loss minimal drainage anterior chest is tender and red no diabetes BP 131/77  Pulse 88  Temp 99.2 F (37.3 C) (Oral)  Resp 18  Ht 5\' 2"  (1.575 m)  Wt 140 lb (63.504 kg)  BMI 25.61 kg/m2  SpO2 95% Physical Exam Afebrile Alert and comfortable but anxious Breath sounds clear No palpable adenopathy in the  neck Sternal incision with redness around the upper third with some granulation tissue and reactive a skin changes no gross drainage no fluctuance no instability but fairly significant cellulitis Abdomen soft Extremities without edema Neuro intact  Diagnostic Tests: No x-rays performed in the Hospital system since 2009  Impression: Recurrent infection of the sternal incision for previous extensive chest wall resection for recurrent cancer. There is cellulitis but no systemic sequela. Previous CT scan performed in the spring showed a collection of fluid around one of the sternal wires and this is probably the site of the current problem. I recommended the patient will place her on her previously effective antibiotic (amoxicillin) and repeat a CT scan and proceed with sternal exploration probable sternal wire removal and at the same time biopsy the area to make sure there is not recurrent cancer. The patient may need an extensive chest wall resection and reconstruction if the Prolene mesh and methyl methacrylate material have become infected. In a patient with metastatic breast cancer more limited measures to control the local process may be a more prudent approach. This was discussed with the patient we will plan on scheduling her for surgery on August 2. Certainly a local debridement, sternal wire removal, and possible PICC line placement for IV antibiotics may be the best initial approach. Plan:

## 2011-09-03 ENCOUNTER — Other Ambulatory Visit: Payer: Self-pay

## 2011-09-03 DIAGNOSIS — L02219 Cutaneous abscess of trunk, unspecified: Secondary | ICD-10-CM

## 2011-09-06 ENCOUNTER — Inpatient Hospital Stay (HOSPITAL_COMMUNITY): Admission: RE | Admit: 2011-09-06 | Payer: Medicare Other | Source: Ambulatory Visit

## 2011-09-07 ENCOUNTER — Ambulatory Visit: Payer: Medicare Other | Admitting: Cardiothoracic Surgery

## 2011-09-08 ENCOUNTER — Inpatient Hospital Stay: Admit: 2011-09-08 | Payer: Self-pay | Admitting: Cardiothoracic Surgery

## 2011-09-08 ENCOUNTER — Encounter: Payer: Self-pay | Admitting: Cardiothoracic Surgery

## 2011-09-08 ENCOUNTER — Ambulatory Visit (INDEPENDENT_AMBULATORY_CARE_PROVIDER_SITE_OTHER): Payer: Medicare Other | Admitting: Cardiothoracic Surgery

## 2011-09-08 VITALS — BP 152/85 | HR 102 | Resp 16 | Ht 62.0 in | Wt 140.0 lb

## 2011-09-08 DIAGNOSIS — L02213 Cutaneous abscess of chest wall: Secondary | ICD-10-CM

## 2011-09-08 DIAGNOSIS — L03319 Cellulitis of trunk, unspecified: Secondary | ICD-10-CM

## 2011-09-08 DIAGNOSIS — L02219 Cutaneous abscess of trunk, unspecified: Secondary | ICD-10-CM

## 2011-09-08 SURGERY — DEBRIDEMENT, WOUND, STERNUM
Anesthesia: General | Site: Chest

## 2011-09-08 MED ORDER — AMOXICILLIN-POT CLAVULANATE 875-125 MG PO TABS: 1.0000 | ORAL_TABLET | Freq: Two times a day (BID) | ORAL | Status: AC

## 2011-09-10 NOTE — Progress Notes (Signed)
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 Edwyna Shell. She returned last week with redness and localized infection at the upper mid sternal area. This is a recurrent process. The same thing happened earlier this spring and she was evaluated with a CT scan which showed some minimal fluid around one of the wires used to reconstruct her chest and she was treated with a course of oral amoxicillin with resolution. She is followed as an outpatient by Dr. Edwyna Shell who last examined the patient in mid June. The patient now returns with recurrent redness and swelling in the same area. She has not been on antibiotics. She denies fever. She was evaluated previously by the Gen. surgery group here in town as well as by her oncologist at [facility]. An aspirate done at that time showed no growth.  She states she has breast cancer which is spread to her bones. Her last CT scan earlier this month at [facility] in early July showed some suspicious areas in the liver but the scan of the chest was fairly unremarkable by report. The images are not available for review.   She was seen last week by Dr Maren Beach who was planning on removal of some sternal wires, she returns today to be seen by me because she wanted a different option then surgery.     Current Activity/ Functional Status: Patient isindependent with mobility/ambulation, transfers, ADL's, IADL's.   Past Medical History  Diagnosis  Date  . Asthma   . Breast cancer     Breast Right  . Hypertension     Past Surgical History  Procedure Date  . Combined hysterectomy abdominal w/ a&p repair / oophorectomy 2005  . Resection sternum and ribs 2003  . Biopsy of right breast lymph nodes 1997    Family History  Problem Relation Age of Onset  . Cancer Mother     Breast  . Cancer Father     prostate  . Cancer Maternal Aunt     breast  . Cancer Maternal Grandmother     breast  . Cancer Paternal Grandfather     lung    History   Social History  . Marital Status: Married    Spouse Name: N/A    Number of Children: N/A  . Years of Education: N/A   Occupational History  . Not on file.   Social History Main Topics  . Smoking status: Never Smoker   . Smokeless tobacco: Never Used  . Alcohol Use: No  . Drug Use: No  . Sexually Active: Not on file   Other Topics Concern  . Not on file   Social History Narrative  . No narrative on file    History  Smoking status  . Never Smoker   Smokeless tobacco  . Never Used  History  Alcohol Use No     Allergies  Allergen Reactions  . Keflex (Cephalexin) Other (See Comments)    Pt does not remember     Current Outpatient Prescriptions  Medication Sig Dispense Refill  . Calcium Carb-Cholecalciferol (CALTRATE 600+D) 600-800 MG-UNIT TABS Take by mouth 2 (two) times daily.      Jennette Banker Sodium 30-100 MG CAPS Take by mouth. Takes 2      . irbesartan (AVAPRO) 300 MG tablet Take 300 mg by mouth at bedtime.      . lactobacillus acidophilus (BACID) TABS Take 2 tablets by mouth 1 day or 1 dose.       . letrozole (FEMARA) 2.5 MG tablet Take 2.5 mg by mouth daily.      Marland Kitchen LORazepam (ATIVAN) 1 MG tablet Take 1 mg by mouth at bedtime.       Marland Kitchen morphine (KADIAN) 80 MG 24 hr capsule Take 80 mg by mouth 2 (two) times daily.       Marland Kitchen morphine (MSIR) 15 MG tablet Take 15 mg by mouth 1 day or 1 dose. Takes once daily      . multivitamin (THERAGRAN) per  tablet Take 1 tablet by mouth once a week.      Marland Kitchen omeprazole (PRILOSEC) 10 MG capsule Take 10 mg by mouth daily.      . ondansetron (ZOFRAN) 4 MG tablet Take 4 mg by mouth every 8 (eight) hours as needed.      . pregabalin (LYRICA) 200 MG capsule Take 100 mg by mouth 2 (two) times daily.      . Zoledronic Acid (ZOMETA IV) Inject into the vein every 30 (thirty) days.      Marland Kitchen amoxicillin-clavulanate (AUGMENTIN) 875-125 MG per tablet Take 1 tablet by mouth 2 (two) times daily.  30 tablet  1       Review of Systems:     Cardiac Review of Systems: Y or N  Chest Pain [  n  ]  Resting SOB [  n ] Exertional SOB  [  y]  Orthopnea Milo.Brash  ]   Pedal Edema [   ]    Palpitations n  ] Syncope  [n ]   Presyncope [  n ]  General Review of Systems: [Y] = yes [  ]=no Constitional: recent weight change Cove.Etienne  ]; anorexia [  ]; fatigue [ y ]; nausea [ y ]; night sweats [  ]; fever [n  ]; or chills [ n ];                                                                                                                                          Dental: poor dentition[  ];   Eye : blurred vision [  ]; diplopia [   ]; vision changes [  ];  Amaurosis fugax[  ]; Resp: cough [  ];  wheezing[  ];  hemoptysis[  ]; shortness of breath[  ]; paroxysmal nocturnal dyspnea[  ]; dyspnea on exertion[  ]; or orthopnea[  ];  GI:  gallstones[  ], vomiting[  ];  dysphagia[  ]; melena[  ];  hematochezia [  ]; heartburn[  ];   Hx of  Colonoscopy[  ]; GU: kidney stones [  ]; hematuria[  ];   dysuria [  ];  nocturia[  ];  history of     obstruction [  ];             Skin: rash, swelling[  ];, hair loss[  ];  peripheral edema[  ];  or itching[  ]; Musculosketetal: myalgias[  ];  joint swelling[  ];  joint erythema[  ];  joint pain[  ];  back pain[  ];  Heme/Lymph: bruising[  ];  bleeding[  ];  anemia[  ];  Neuro: TIA[  ];  headaches[  ];  stroke[  ];  vertigo[  ];  seizures[  ];   paresthesias[  ];  difficulty walking[  ];  Psych:depression[  ];  anxiety[  ];  Endocrine: diabetes[  ];  thyroid dysfunction[  ];  Immunizations: Flu [  ]; Pneumococcal[  ];  Other:  Physical Exam: BP 152/85  Pulse 102  Resp 16  Ht 5\' 2"  (1.575 m)  Wt 140 lb (63.504 kg)  BMI 25.61 kg/m2  SpO2 98%  General appearance: alert, cooperative and appears stated age Neurologic: intact Heart: regular rate and rhythm, S1, S2 normal, no murmur, click, rub or gallop and normal apical impulse Lungs: clear to auscultation bilaterally and normal percussion bilaterally Abdomen: soft, non-tender; bowel sounds normal; no masses,  no organomegaly Extremities: extremities normal, atraumatic, no cyanosis or edema and Homans sign is negative, no sign of DVT Wound: erythmis upper chest wall midline, patient notes this has been present for year, area lower sternal midline incision with out drainage but with some granulation tissue.   Diagnostic Studies & Laboratory data:     Recent Radiology Findings:   No results found. un able to open cd from [facility]. 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 She Has improved on po antibiotics, no culture material has been obtained. There is possibility that the skin reaction over chest wall is malignant, but no obvious suspicious area to bx now. Patient agreeable to antibiotic suppressive therapy for now. To see oncology next week. I Will see back in two weeks.   If the prosthetic  material requires removal will be significant  undertaking in patient with possible recurrent metastatic breast ca.     Delight Ovens MD  Beeper [phone] Office [phone] 09/10/2011 10:01 PM

## 2011-09-21 ENCOUNTER — Encounter: Payer: Self-pay | Admitting: Cardiothoracic Surgery

## 2011-09-21 ENCOUNTER — Ambulatory Visit (INDEPENDENT_AMBULATORY_CARE_PROVIDER_SITE_OTHER): Payer: Medicare Other | Admitting: Cardiothoracic Surgery

## 2011-09-21 VITALS — BP 120/79 | HR 82 | Resp 16 | Ht 62.0 in | Wt 140.0 lb

## 2011-09-21 DIAGNOSIS — L02219 Cutaneous abscess of trunk, unspecified: Secondary | ICD-10-CM

## 2011-09-21 DIAGNOSIS — L03319 Cellulitis of trunk, unspecified: Secondary | ICD-10-CM

## 2011-09-21 MED ORDER — AMOXICILLIN-POT CLAVULANATE 875-125 MG PO TABS: 1.0000 | ORAL_TABLET | Freq: Two times a day (BID) | ORAL | Status: DC

## 2011-09-21 MED ORDER — ONDANSETRON HCL 4 MG PO TABS: 4.0000 mg | ORAL_TABLET | Freq: Two times a day (BID) | ORAL | Status: DC | PRN

## 2011-09-21 NOTE — Progress Notes (Signed)
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 Edwyna Shell. She returned last week with redness and localized infection at the upper mid sternal area. This is a recurrent process. The same thing happened earlier this spring and she was evaluated with a CT scan which showed some minimal fluid around one of the wires used to reconstruct her chest and she was treated with a course of oral amoxicillin with resolution. She is followed as an outpatient by Dr. Edwyna Shell who last examined the patient in mid June. The patient now returns with recurrent redness and swelling in the same area. She has not been on antibiotics. She denies fever. She was evaluated previously by the Gen. surgery group here in town as well as by her oncologist at [facility]. An aspirate done at that time showed no growth.  She states she has breast cancer which is spread to her bones. Her last CT scan earlier this month at [facility] in early July showed some suspicious areas in the liver but the scan of the chest was fairly unremarkable by report. The images are not available for review.   She was seen last week by Dr Maren Beach who was planning on removal of some sternal wires, she returns today to be seen by me because she wanted a different option then surgery.     Current Activity/ Functional Status: Patient isindependent with mobility/ambulation, transfers, ADL's, IADL's.   Past Medical History  Diagnosis  Date  . Asthma   . Breast cancer     Breast Right  . Hypertension     Past Surgical History  Procedure Date  . Combined hysterectomy abdominal w/ a&p repair / oophorectomy 2005  . Resection sternum and ribs 2003  . Biopsy of right breast lymph nodes 1997    Family History  Problem Relation Age of Onset  . Cancer Mother     Breast  . Cancer Father     prostate  . Cancer Maternal Aunt     breast  . Cancer Maternal Grandmother     breast  . Cancer Paternal Grandfather     lung    History   Social History  . Marital Status: Married    Spouse Name: N/A    Number of Children: N/A  . Years of Education: N/A   Occupational History  . Not on file.   Social History Main Topics  . Smoking status: Never Smoker   . Smokeless tobacco: Never Used  . Alcohol Use: No  . Drug Use: No  . Sexually Active: Not on file   Other Topics Concern  . Not on file   Social History Narrative  . No narrative on file    History  Smoking status  . Never Smoker   Smokeless tobacco  . Never  Used    History  Alcohol Use No     Allergies  Allergen Reactions  . Keflex (Cephalexin) Other (See Comments)    Pt does not remember     Current Outpatient Prescriptions  Medication Sig Dispense Refill  . amoxicillin-clavulanate (AUGMENTIN) 875-125 MG per tablet Take 1 tablet by mouth 2 (two) times daily.  42 tablet  1  . Calcium Carb-Cholecalciferol (CALTRATE 600+D) 600-800 MG-UNIT TABS Take by mouth 2 (two) times daily.      Jennette Banker Sodium 30-100 MG CAPS Take by mouth. Takes 2      . Fulvestrant (FASLODEX) 125 MG/2.5ML SOLN Inject into the muscle. 2 injections every two weeks      . irbesartan (AVAPRO) 300 MG tablet Take 300 mg by mouth at bedtime.      . lactobacillus acidophilus (BACID) TABS Take 2 tablets by mouth 1 day or 1 dose.       . letrozole (FEMARA) 2.5 MG tablet Take 2.5 mg by mouth daily.      Marland Kitchen LORazepam (ATIVAN) 1 MG tablet Take 1 mg by mouth at bedtime.        Marland Kitchen morphine (KADIAN) 80 MG 24 hr capsule Take 80 mg by mouth 2 (two) times daily.       Marland Kitchen morphine (MSIR) 15 MG tablet Take 15 mg by mouth 1 day or 1 dose. Takes once daily      . multivitamin (THERAGRAN) per tablet Take 1 tablet by mouth once a week.      Marland Kitchen omeprazole (PRILOSEC) 10 MG capsule Take 10 mg by mouth daily.      . ondansetron (ZOFRAN) 4 MG tablet Take 1 tablet (4 mg total) by mouth every 12 (twelve) hours as needed.  20 tablet  1  . pregabalin (LYRICA) 200 MG capsule Take 100 mg by mouth 2 (two) times daily.      . Zoledronic Acid (ZOMETA IV) Inject into the vein every 30 (thirty) days.           Review of Systems:     Cardiac Review of Systems: Y or N  Chest Pain [  n  ]  Resting SOB [  n ] Exertional SOB  [  y]  Orthopnea Milo.Brash  ]   Pedal Edema [   ]    Palpitations n  ] Syncope  [n ]   Presyncope [  n ]  General Review of Systems: [Y] = yes [  ]=no Constitional: recent weight change Cove.Etienne  ]; anorexia [  ]; fatigue [ y ]; nausea [ y ]; night sweats [  ]; fever [n  ]; or chills [ n ];                                                                                                                                          Dental: poor dentition[  ];  Eye : blurred vision [  ]; diplopia [   ]; vision changes [  ];  Amaurosis fugax[  ]; Resp: cough [  ];  wheezing[  ];  hemoptysis[  ]; shortness of breath[  ]; paroxysmal nocturnal dyspnea[  ]; dyspnea on exertion[  ]; or orthopnea[  ];  GI:  gallstones[  ], vomiting[  ];  dysphagia[  ]; melena[  ];  hematochezia [  ]; heartburn[  ];   Hx of  Colonoscopy[  ]; GU: kidney stones [  ]; hematuria[  ];   dysuria [  ];  nocturia[  ];  history of     obstruction [  ];             Skin: rash, swelling[  ];, hair loss[  ];  peripheral edema[  ];  or itching[  ]; Musculosketetal: myalgias[  ];  joint swelling[  ];  joint erythema[  ];  joint pain[  ];  back pain[  ];  Heme/Lymph: bruising[  ];  bleeding[  ];  anemia[  ];  Neuro: TIA[  ];   headaches[  ];  stroke[  ];  vertigo[  ];  seizures[  ];   paresthesias[  ];  difficulty walking[  ];  Psych:depression[  ]; anxiety[  ];  Endocrine: diabetes[  ];  thyroid dysfunction[  ];  Immunizations: Flu [  ]; Pneumococcal[  ];  Other:  Physical Exam: BP 120/79  Pulse 82  Resp 16  Ht 5\' 2"  (1.575 m)  Wt 140 lb (63.504 kg)  BMI 25.61 kg/m2  SpO2 94%  General appearance: alert, cooperative and appears stated age Neurologic: intact Heart: regular rate and rhythm, S1, S2 normal, no murmur, click, rub or gallop and normal apical impulse Lungs: clear to auscultation bilaterally and normal percussion bilaterally Abdomen: soft, non-tender; bowel sounds normal; no masses,  no organomegaly Extremities: extremities normal, atraumatic, no cyanosis or edema and Homans sign is negative, no sign of DVT Wound: erythmis upper chest wall midline, patient notes this has been present for year, area lower sternal midline incision with out drainage but with some granulation tissue. No drainage from incision and appears improved with less reddeness then when seen two weeks ago  Diagnostic Studies & Laboratory data:     Recent Radiology Findings:   No results found.  un able to open cd from [facility]. 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 She Has improved on po antibiotics, no culture material has been obtained. There is possibility that the  skin reaction over chest wall is malignant, but no obvious suspicious area to bx now. Patient agreeable to antibiotic suppressive therapy for now. To see oncology next week. I Will see back in 3 weeks.  She will take antibiotics twice a day for addition 2  weeks then once a day until seen back   Delight Ovens MD  Beeper [phone] Office [phone] 09/21/2011 5:10 PM

## 2011-09-26 ENCOUNTER — Telehealth: Payer: Self-pay | Admitting: *Deleted

## 2011-09-26 NOTE — Telephone Encounter (Signed)
Patient received records from Medical records and called for clarification.  Twenty minutes answering questions from patient asking what treatment she received, dosages and response to treatment.  Received thirty one pages from [facility] And would like something simpler.  Seeing Dr. Lennie Odor at [facility] tomorrow for faslodex injections.  Reports Dr. Theodora Blow asked her to obtain this information.  Dr.'s fax number is [phone].  Pt. Received Faslodex 250 mg monthly from December 2005 through March 2007.  She asked about arimidex and zometa.

## 2011-10-06 ENCOUNTER — Other Ambulatory Visit: Payer: Self-pay | Admitting: *Deleted

## 2011-10-06 DIAGNOSIS — L0291 Cutaneous abscess, unspecified: Secondary | ICD-10-CM

## 2011-10-06 DIAGNOSIS — C801 Malignant (primary) neoplasm, unspecified: Secondary | ICD-10-CM

## 2011-10-06 MED ORDER — AMOXICILLIN-POT CLAVULANATE 875-125 MG PO TABS: 1.0000 | ORAL_TABLET | Freq: Two times a day (BID) | ORAL | Status: DC

## 2011-10-06 MED ORDER — ONDANSETRON HCL 4 MG PO TABS: 4.0000 mg | ORAL_TABLET | Freq: Two times a day (BID) | ORAL | Status: DC | PRN

## 2011-10-19 ENCOUNTER — Encounter: Payer: Self-pay | Admitting: Cardiothoracic Surgery

## 2011-10-19 ENCOUNTER — Ambulatory Visit (INDEPENDENT_AMBULATORY_CARE_PROVIDER_SITE_OTHER): Payer: Medicare Other | Admitting: Cardiothoracic Surgery

## 2011-10-19 VITALS — BP 126/86 | HR 88 | Resp 20 | Ht 62.0 in | Wt 133.0 lb

## 2011-10-19 DIAGNOSIS — L02219 Cutaneous abscess of trunk, unspecified: Secondary | ICD-10-CM

## 2011-10-19 NOTE — Patient Instructions (Signed)
Continue Augmentin twice a day for two weeks then once a day Return in 4 weeks

## 2011-10-19 NOTE — Progress Notes (Signed)
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 Edwyna Shell. She returned last week with redness and localized infection at the upper mid sternal area. This is a recurrent process. The same thing happened earlier this spring and she was evaluated with a CT scan which showed some minimal fluid around one of the wires used to reconstruct her chest and she was treated with a course of oral amoxicillin with resolution. She is followed as an outpatient by Dr. Edwyna Shell who last examined the patient in mid June. The patient now returns with recurrent redness and swelling in the same area. She has not been on antibiotics. She denies fever. She was evaluated previously by the Gen. surgery group here in town as well as by her oncologist at [facility]. An aspirate done at that time showed no growth.  She states she has breast cancer which is spread to her bones. Her last CT scan earlier this month at [facility] in early July showed some suspicious areas in the liver but the scan of the chest was fairly unremarkable by report. The images are not available for review.     Current Activity/ Functional Status: Patient isindependent with mobility/ambulation, transfers, ADL's, IADL's.   Past Medical History  Diagnosis Date  . Asthma   . Breast cancer     Breast Right  . Hypertension     Past Surgical History  Procedure Date  . Combined hysterectomy abdominal w/ a&p  repair / oophorectomy 2005  . Resection sternum and ribs 2003  . Biopsy of right breast lymph nodes 1997    Family History  Problem Relation Age of Onset  . Cancer Mother     Breast  . Cancer Father     prostate  . Cancer Maternal Aunt     breast  . Cancer Maternal Grandmother     breast  . Cancer Paternal Grandfather     lung    History   Social History  . Marital Status: Married    Spouse Name: N/A    Number of Children: N/A  . Years of Education: N/A   Occupational History  . Not on file.   Social History Main Topics  . Smoking status: Never Smoker   . Smokeless tobacco: Never Used  . Alcohol Use: No  . Drug Use: No  . Sexually Active: Not on file   Other Topics Concern  . Not on file   Social History Narrative  . No narrative on file    History  Smoking status  . Never Smoker   Smokeless tobacco  . Never Used    History  Alcohol Use No     Allergies  Allergen Reactions  . Keflex (Cephalexin) Other (See Comments)    Pt does not remember     Current Outpatient Prescriptions  Medication Sig Dispense Refill  . amoxicillin-clavulanate (AUGMENTIN) 875-125 MG per tablet Take 1 tablet by mouth 2 (two) times daily.  12 tablet  1  . Calcium Carb-Cholecalciferol (CALTRATE 600+D) 600-800 MG-UNIT TABS Take by mouth 2 (two) times daily.      Jennette Banker Sodium 30-100 MG CAPS Take by mouth. Takes 2      . Fulvestrant (FASLODEX) 125 MG/2.5ML SOLN Inject into the muscle. 2 injections every two weeks      . irbesartan (AVAPRO) 300 MG tablet Take 300 mg by mouth at bedtime.      . lactobacillus acidophilus (BACID) TABS Take 2 tablets by mouth 1 day or 1 dose.       . letrozole (FEMARA) 2.5 MG tablet Take 2.5 mg by mouth daily.      Marland Kitchen LORazepam (ATIVAN) 1 MG tablet Take 1 mg by mouth at bedtime.       Marland Kitchen morphine (KADIAN) 80 MG 24 hr capsule Take 80 mg by mouth 2 (two) times daily.       Marland Kitchen morphine (MSIR) 15 MG tablet Take 15 mg by mouth 1 day or 1  dose. Takes once daily      . multivitamin (THERAGRAN) per tablet Take 1 tablet by mouth once a week.      Marland Kitchen omeprazole (PRILOSEC) 10 MG capsule Take 10 mg by mouth daily.      . ondansetron (ZOFRAN) 4 MG tablet Take 1 tablet (4 mg total) by mouth every 12 (twelve) hours as needed.  30 tablet  1  . pregabalin (LYRICA) 200 MG capsule Take 100 mg by mouth 2 (two) times daily.      . Zoledronic Acid (ZOMETA IV) Inject into the vein every 30 (thirty) days.           Review of Systems:     Cardiac Review of Systems: Y or N  Chest Pain [  n  ]  Resting SOB [  n ] Exertional SOB  [  y]  Orthopnea Milo.Brash  ]   Pedal Edema [   ]    Palpitations n  ] Syncope  [n ]   Presyncope [  n ]  General Review of Systems: [Y] = yes [  ]=no Constitional: recent weight change Cove.Etienne  ]; anorexia [  ]; fatigue [ y ]; nausea [ y ]; night sweats [  ]; fever [n  ]; or chills [ n ];                                                                                                                                          Dental: poor dentition[  ];   Eye : blurred vision [  ]; diplopia [   ];  vision changes [  ];  Amaurosis fugax[  ]; Resp: cough [  ];  wheezing[  ];  hemoptysis[  ]; shortness of breath[  ]; paroxysmal nocturnal dyspnea[  ]; dyspnea on exertion[  ]; or orthopnea[  ];  GI:  gallstones[  ], vomiting[  ];  dysphagia[  ]; melena[  ];  hematochezia [  ]; heartburn[  ];   Hx of  Colonoscopy[  ]; GU: kidney stones [  ]; hematuria[  ];   dysuria [  ];  nocturia[  ];  history of     obstruction [  ];             Skin: rash, swelling[  ];, hair loss[  ];  peripheral edema[  ];  or itching[  ]; Musculosketetal: myalgias[  ];  joint swelling[  ];  joint erythema[  ];  joint pain[  ];  back pain[  ];  Heme/Lymph: bruising[  ];  bleeding[  ];  anemia[  ];  Neuro: TIA[  ];  headaches[  ];  stroke[  ];  vertigo[  ];  seizures[  ];   paresthesias[  ];  difficulty walking[  ];  Psych:depression[  ]; anxiety[  ];  Endocrine:  diabetes[  ];  thyroid dysfunction[  ];  Immunizations: Flu [  ]; Pneumococcal[  ];  Other:  Physical Exam: BP 126/86  Pulse 88  Resp 20  Ht 5\' 2"  (1.575 m)  Wt 133 lb (60.328 kg)  BMI 24.33 kg/m2  SpO2 97%  General appearance: alert, cooperative and appears stated age Neurologic: intact Heart: regular rate and rhythm, S1, S2 normal, no murmur, click, rub or gallop and normal apical impulse Lungs: clear to auscultation bilaterally and normal percussion bilaterally Abdomen: soft, non-tender; bowel sounds normal; no masses,  no organomegaly Extremities: extremities normal, atraumatic, no cyanosis or edema and Homans sign is negative, no sign of DVT Wound: erythmis upper chest wall midline, patient notes this has been present for year, area lower sternal midline incision with out drainage but with some granulation tissue. No drainage from incision and appears improved with less reddeness then when seen two weeks ago  Diagnostic Studies & Laboratory data:     Recent Radiology Findings:      Recent Lab Findings: Lab Results  Component Value Date   WBC 6.4 04/05/2007   HGB 11.1* 04/05/2007   HCT 32.4* 04/05/2007   PLT 204 04/05/2007   GLUCOSE 107* 04/05/2007   ALT 21 04/01/2007   AST 31 04/01/2007   NA 140 04/05/2007   K 3.5 04/05/2007   CL 111 04/05/2007   CREATININE 0.53 04/05/2007   BUN 1* 04/05/2007   CO2 24 04/05/2007   INR 1.4 04/03/2007      Assessment / Plan:      Patient with question of recurrent metastatic breast CA and 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 She Has improved on po antibiotics, no culture material has been obtained. There is possibility that the skin reaction over chest wall is malignant, but no obvious suspicious area to bx now. Patient agreeable to antibiotic suppressive therapy for now. To see oncology next week. I Will see back in 3 weeks.  She will take antibiotics  twice a day for addition 2 weeks then once a day until seen back   Delight Ovens MD  Beeper [phone] Office [phone] 10/19/2011 4:09 PM

## 2011-10-19 NOTE — Progress Notes (Signed)
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 Edwyna Shell. She returned last week with redness and localized infection at the upper mid sternal area. This is a recurrent process. The same thing happened earlier this spring and she was evaluated with a CT scan which showed some minimal fluid around one of the wires used to reconstruct her chest and she was treated with a course of oral amoxicillin with resolution. She is followed as an outpatient by Dr. Edwyna Shell who last examined the patient in mid June. The patient now returns with recurrent redness and swelling in the same area. She has not been on antibiotics. She denies fever. She was evaluated previously by the Gen. surgery group here in town as well as by her oncologist at [facility]. An aspirate done at that time showed no growth.  She states she has breast cancer which is spread to her bones. Her last CT scan earlier this month at [facility] in early July showed some suspicious areas in the liver but the scan of the chest was fairly unremarkable by report. The images are not available for review.   No fevers or chills, some hot flashes No drainage now Still taking antibiotics twice a day Augmentin     Current Activity/ Functional Status: Patient isindependent with mobility/ambulation, transfers, ADL's, IADL's.   Past Medical History  Diagnosis Date  . Asthma   . Breast cancer     Breast Right    . Hypertension     Past Surgical History  Procedure Date  . Combined hysterectomy abdominal w/ a&p repair / oophorectomy 2005  . Resection sternum and ribs 2003  . Biopsy of right breast lymph nodes 1997    Family History  Problem Relation Age of Onset  . Cancer Mother     Breast  . Cancer Father     prostate  . Cancer Maternal Aunt     breast  . Cancer Maternal Grandmother     breast  . Cancer Paternal Grandfather     lung    History   Social History  . Marital Status: Married    Spouse Name: N/A    Number of Children: N/A  . Years of Education: N/A   Occupational History  . Not on file.   Social History Main Topics  . Smoking status: Never Smoker   . Smokeless tobacco: Never Used  . Alcohol Use: No  . Drug Use: No  . Sexually Active: Not on file   Other Topics Concern  . Not on file   Social History Narrative  . No narrative on file    History  Smoking status  . Never Smoker   Smokeless  tobacco  . Never Used    History  Alcohol Use No     Allergies  Allergen Reactions  . Keflex (Cephalexin) Other (See Comments)    Pt does not remember     Current Outpatient Prescriptions  Medication Sig Dispense Refill  . amoxicillin-clavulanate (AUGMENTIN) 875-125 MG per tablet Take 1 tablet by mouth 2 (two) times daily.  12 tablet  1  . Calcium Carb-Cholecalciferol (CALTRATE 600+D) 600-800 MG-UNIT TABS Take by mouth 2 (two) times daily.      Jennette Banker Sodium 30-100 MG CAPS Take by mouth. Takes 2      . Fulvestrant (FASLODEX) 125 MG/2.5ML SOLN Inject 250 mg into the muscle. 1 injection once a month      . irbesartan (AVAPRO) 300 MG tablet Take 300 mg by mouth at bedtime.      . lactobacillus acidophilus (BACID) TABS Take 2 tablets by mouth 1 day or 1 dose.       Marland Kitchen LORazepam (ATIVAN) 1 MG tablet Take 1 mg by mouth at bedtime.       Marland Kitchen morphine (KADIAN) 80 MG 24 hr capsule Take 80 mg by mouth 2 (two) times daily.       Marland Kitchen morphine (MSIR) 15 MG  tablet Take 15 mg by mouth 1 day or 1 dose. Takes once daily      . multivitamin (THERAGRAN) per tablet Take 1 tablet by mouth once a week.      . ondansetron (ZOFRAN) 4 MG tablet Take 1 tablet (4 mg total) by mouth every 12 (twelve) hours as needed.  30 tablet  1  . pregabalin (LYRICA) 200 MG capsule Take 100 mg by mouth 2 (two) times daily.      . Zoledronic Acid (ZOMETA IV) Inject into the vein every 30 (thirty) days.           Review of Systems:     Cardiac Review of Systems: Y or N  Chest Pain [  n  ]  Resting SOB [  n ] Exertional SOB  [  y]  Orthopnea Milo.Brash  ]   Pedal Edema [   ]    Palpitations n  ] Syncope  [n ]   Presyncope [  n ]  General Review of Systems: [Y] = yes [  ]=no Constitional: recent weight change Cove.Etienne  ]; anorexia [  ]; fatigue [ y ]; nausea [ y ]; night sweats [  ]; fever [n  ]; or chills [ n ];                                                                                                                                          Dental: poor dentition[  ];   Eye : blurred vision [  ]; diplopia [   ]; vision changes [  ];  Amaurosis fugax[  ]; Resp: cough [  ];  wheezing[  ];  hemoptysis[  ]; shortness of breath[  ]; paroxysmal nocturnal dyspnea[  ]; dyspnea on exertion[  ]; or orthopnea[  ];  GI:  gallstones[  ], vomiting[  ];  dysphagia[  ]; melena[  ];  hematochezia [  ]; heartburn[  ];   Hx of  Colonoscopy[  ]; GU: kidney stones [  ]; hematuria[  ];   dysuria [  ];  nocturia[  ];  history of     obstruction [  ];             Skin: rash, swelling[  ];, hair loss[  ];  peripheral edema[  ];  or itching[  ]; Musculosketetal: myalgias[  ];  joint swelling[  ];  joint erythema[  ];  joint pain[  ];  back pain[  ];  Heme/Lymph: bruising[  ];  bleeding[  ];  anemia[  ];  Neuro: TIA[  ];  headaches[  ];  stroke[  ];  vertigo[  ];  seizures[  ];   paresthesias[  ];  difficulty walking[  ];  Psych:depression[  ]; anxiety[  ];  Endocrine: diabetes[  ];  thyroid dysfunction[   ];  Immunizations: Flu [  ]; Pneumococcal[  ];  Other:  Physical Exam: BP 126/86  Pulse 88  Resp 20  Ht 5\' 2"  (1.575 m)  Wt 133 lb (60.328 kg)  BMI 24.33 kg/m2  SpO2 97%  General appearance: alert, cooperative and appears stated age Neurologic: intact Heart: regular rate and rhythm, S1, S2 normal, no murmur, click, rub or gallop and normal apical impulse Lungs: clear to auscultation bilaterally and normal percussion bilaterally Abdomen: soft, non-tender; bowel sounds normal; no masses,  no organomegaly Extremities: extremities normal, atraumatic, no cyanosis or edema and Homans sign is negative, no sign of DVT Wound: erythmis upper chest wall midline, patient notes this has been present for year, area lower sternal midline incision with out drainage but with some granulation tissue. No drainage from incision and appears improved with less reddeness then when seen two weeks ago  Diagnostic Studies & Laboratory data:     Recent Radiology Findings:   No results found.  un able to open cd from [facility]. 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 She Has improved on po antibiotics, no culture material has been obtained. There is possibility that the skin reaction over chest wall is malignant, but no obvious suspicious area to bx now. Patient agreeable to antibiotic suppressive therapy for now. To see oncology next week.   I Will see  back in 4 weeks.   She will take antibiotics twice a day for addition 2 weeks then once a day until seen back.   Delight Ovens MD  Beeper [phone] Office [phone] 10/19/2011 4:17 PM

## 2011-11-16 ENCOUNTER — Ambulatory Visit (INDEPENDENT_AMBULATORY_CARE_PROVIDER_SITE_OTHER): Payer: Medicare Other | Admitting: Cardiothoracic Surgery

## 2011-11-16 ENCOUNTER — Encounter: Payer: Self-pay | Admitting: Cardiothoracic Surgery

## 2011-11-16 VITALS — BP 115/73 | HR 74 | Temp 97.7°F | Resp 16 | Ht 62.0 in | Wt 140.0 lb

## 2011-11-16 DIAGNOSIS — L03319 Cellulitis of trunk, unspecified: Secondary | ICD-10-CM

## 2011-11-16 DIAGNOSIS — L02219 Cutaneous abscess of trunk, unspecified: Secondary | ICD-10-CM

## 2011-11-16 NOTE — Patient Instructions (Signed)
Wound looks good Return 2 months

## 2011-11-16 NOTE — Progress Notes (Signed)
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 Edwyna Shell. She returned last week with redness and localized infection at the upper mid sternal area. This is a recurrent process. The same thing happened earlier this spring and she was evaluated with a CT scan which showed some minimal fluid around one of the wires used to reconstruct her chest and she was treated with a course of oral amoxicillin with resolution. She is followed as an outpatient by Dr. Edwyna Shell who last examined the patient in mid June. The patient now returns with recurrent redness and swelling in the same area. She has not been on antibiotics. She denies fever. She was evaluated previously by the Gen. surgery group here in town as well as by her oncologist at [facility]. An aspirate done at that time showed no growth.  She states she has breast cancer which is spread to her bones. Her last CT scan earlier this month at [facility] in early July showed some suspicious areas in the liver but the scan of the chest was fairly unremarkable by report. The images are not available for review. She has had MRI of liver 11/06/2011 with increase in size and new liver lesions consistent with stage 4 Breast CA  Chest incision has not been causing her any pain nor is there evidence of drainage  No fevers or chills, some hot flashes No drainage now Still taking antibiotics twice a day  Augmentin     Current Activity/ Functional Status: Patient isindependent with mobility/ambulation, transfers, ADL's, IADL's.   Past Medical History  Diagnosis Date  . Asthma   . Breast cancer     Breast Right  . Hypertension     Past Surgical History  Procedure Date  . Combined hysterectomy abdominal w/ a&p repair / oophorectomy 2005  . Resection sternum and ribs 2003  . Biopsy of right breast lymph nodes 1997    Family History  Problem Relation Age of Onset  . Cancer Mother     Breast  . Cancer Father     prostate  . Cancer Maternal Aunt     breast  . Cancer Maternal Grandmother     breast  . Cancer Paternal Grandfather     lung    History   Social History  . Marital Status: Married    Spouse Name: N/A    Number of Children: N/A  . Years of Education: N/A   Occupational History  . Not on file.   Social History Main Topics  . Smoking status: Never Smoker   . Smokeless  tobacco: Never Used  . Alcohol Use: No  . Drug Use: No  . Sexually Active: Not on file   Other Topics Concern  . Not on file   Social History Narrative  . No narrative on file    History  Smoking status  . Never Smoker   Smokeless tobacco  . Never Used    History  Alcohol Use No     Allergies  Allergen Reactions  . Keflex (Cephalexin) Other (See Comments)    Pt does not remember     Current Outpatient Prescriptions  Medication Sig Dispense Refill  . amoxicillin-clavulanate (AUGMENTIN) 875-125 MG per tablet Take 1 tablet by mouth 1 day or 1 dose.      . Calcium Carb-Cholecalciferol (CALTRATE 600+D) 600-800 MG-UNIT TABS Take by mouth 2 (two) times daily.      . irbesartan (AVAPRO) 300 MG tablet Take 300 mg by mouth at bedtime.      . lactobacillus acidophilus (BACID) TABS Take 2 tablets by mouth 1 day or 1 dose.       Marland Kitchen LORazepam (ATIVAN) 1 MG tablet Take 1 mg by mouth at bedtime.       Marland Kitchen morphine (KADIAN) 80 MG 24 hr capsule Take 80 mg by mouth 2 (two) times daily.        Marland Kitchen morphine (MSIR) 15 MG tablet Take 15 mg by mouth 1 day or 1 dose. Takes once daily      . multivitamin (THERAGRAN) per tablet Take 1 tablet by mouth once a week.      . ondansetron (ZOFRAN) 4 MG tablet Take 1 tablet (4 mg total) by mouth every 12 (twelve) hours as needed.  30 tablet  1  . pregabalin (LYRICA) 200 MG capsule Take 100 mg by mouth 2 (two) times daily.      . Zoledronic Acid (ZOMETA IV) Inject into the vein every 30 (thirty) days.      Jennette Banker Sodium 30-100 MG CAPS Take by mouth. Takes 2      . Fulvestrant (FASLODEX) 125 MG/2.5ML SOLN Inject 250 mg into the muscle. 1 injection once a month           Review of Systems:     Cardiac Review of Systems: Y or N  Chest Pain [  n  ]  Resting SOB [  n ] Exertional SOB  [  y]  Orthopnea Milo.Brash  ]   Pedal Edema [   ]    Palpitations n  ] Syncope  [n ]   Presyncope [  n ]  General Review of Systems: [Y] = yes [  ]=no Constitional: recent weight change Cove.Etienne  ]; anorexia [  ]; fatigue [ y ]; nausea [ y ]; night sweats [  ]; fever [n  ]; or chills [ n ];  Dental: poor dentition[  ];   Eye : blurred vision [  ]; diplopia [   ]; vision changes [  ];  Amaurosis fugax[  ]; Resp: cough [  ];  wheezing[  ];  hemoptysis[  ]; shortness of breath[  ]; paroxysmal nocturnal dyspnea[  ]; dyspnea on exertion[  ]; or orthopnea[  ];  GI:  gallstones[  ], vomiting[  ];  dysphagia[  ]; melena[  ];  hematochezia [  ]; heartburn[  ];   Hx of  Colonoscopy[  ]; GU: kidney stones [  ]; hematuria[  ];   dysuria [  ];  nocturia[  ];  history of     obstruction [  ];             Skin: rash, swelling[  ];, hair loss[  ];  peripheral edema[  ];  or itching[  ]; Musculosketetal: myalgias[  ];  joint swelling[  ];  joint erythema[  ];  joint pain[  ];  back pain[  ];  Heme/Lymph: bruising[  ];  bleeding[  ];  anemia[  ];    Neuro: TIA[  ];  headaches[  ];  stroke[  ];  vertigo[  ];  seizures[  ];   paresthesias[  ];  difficulty walking[  ];  Psych:depression[  ]; anxiety[  ];  Endocrine: diabetes[  ];  thyroid dysfunction[  ];  Immunizations: Flu [  ]; Pneumococcal[  ];  Other:  Physical Exam: BP 115/73  Pulse 74  Temp 97.7 F (36.5 C) (Oral)  Resp 16  Ht 5\' 2"  (1.575 m)  Wt 140 lb (63.504 kg)  BMI 25.61 kg/m2  SpO2 97%  General appearance: alert, cooperative and appears stated age Neurologic: intact Heart: regular rate and rhythm, S1, S2 normal, no murmur, click, rub or gallop and normal apical impulse Lungs: clear to auscultation bilaterally and normal percussion bilaterally Abdomen: soft, non-tender; bowel sounds normal; no masses,  no organomegaly Extremities: extremities normal, atraumatic, no cyanosis or edema and Homans sign is negative, no sign of DVT Wound: erythmis upper chest wall midline, patient notes this has been present for year, area lower sternal midline incision with out drainage but with some granulation tissue. No drainage from incision and appears improved with less reddeness then when seen on previous visit  Diagnostic Studies & Laboratory data:     Recent Radiology Findings:   No results found.  recent MRI of liver at [facility]  Recent Lab Findings: Lab Results  Component Value Date   WBC 6.4 04/05/2007   HGB 11.1* 04/05/2007   HCT 32.4* 04/05/2007   PLT 204 04/05/2007   GLUCOSE 107* 04/05/2007   ALT 21 04/01/2007   AST 31 04/01/2007   NA 140 04/05/2007   K 3.5 04/05/2007   CL 111 04/05/2007   CREATININE 0.53 04/05/2007   BUN 1* 04/05/2007   CO2 24 04/05/2007   INR 1.4 04/03/2007      Assessment / Plan:      Patient with question of recurrent metastatic breast CA and 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 She Has improved on po antibiotics, no culture material has been obtained.    Patient agreeable to antibiotic suppressive therapy for now.   I Will see back in 8 weeks.  To start chemotherapy next week   Delight Ovens MD  Beeper [phone] Office [phone] 11/16/2011 12:22 PM

## 2011-12-04 ENCOUNTER — Other Ambulatory Visit: Payer: Self-pay | Admitting: *Deleted

## 2011-12-04 DIAGNOSIS — C801 Malignant (primary) neoplasm, unspecified: Secondary | ICD-10-CM

## 2011-12-04 MED ORDER — ONDANSETRON HCL 4 MG PO TABS: 4.0000 mg | ORAL_TABLET | Freq: Two times a day (BID) | ORAL | Status: DC | PRN

## 2012-01-18 ENCOUNTER — Ambulatory Visit (INDEPENDENT_AMBULATORY_CARE_PROVIDER_SITE_OTHER): Payer: Medicare Other | Admitting: Cardiothoracic Surgery

## 2012-01-18 ENCOUNTER — Encounter: Payer: Self-pay | Admitting: Cardiothoracic Surgery

## 2012-01-18 VITALS — BP 119/75 | HR 85 | Resp 18 | Ht 62.0 in | Wt 140.0 lb

## 2012-01-18 DIAGNOSIS — L02219 Cutaneous abscess of trunk, unspecified: Secondary | ICD-10-CM

## 2012-01-18 DIAGNOSIS — L03319 Cellulitis of trunk, unspecified: Secondary | ICD-10-CM

## 2012-01-18 NOTE — Progress Notes (Signed)
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 Edwyna Shell. She returned last week with redness and localized infection at the upper mid sternal area. This is a recurrent process. The same thing happened earlier this spring and she was evaluated with a CT scan which showed some minimal fluid around one of the wires used to reconstruct her chest and she was treated with a course of oral amoxicillin with resolution. She is followed as an outpatient by Dr. Edwyna Shell who last examined the patient in mid June. The patient now returns with recurrent redness and swelling in the same area. She has not been on antibiotics. She denies fever. She was evaluated previously by the Gen. surgery group here in town as well as by her oncologist at [facility]. An aspirate done at that time showed no growth.  She states she has breast cancer which is spread to her bones. Her last CT scan earlier this month at [facility] in early July showed some suspicious areas in the liver but the scan of the chest was fairly unremarkable by report. The images are not available for review. She has had MRI of liver 11/06/2011 with increase in size and new liver lesions consistent with stage 4 Breast CA   Since last visit she is now on Zeloda,  No fevers or chills, No drainage now Still taking antibiotics twice a day Augmentin   No chest wall pain  Current Activity/ Functional  Status: Patient isindependent with mobility/ambulation, transfers, ADL's, IADL's.   Past Medical History  Diagnosis Date  . Asthma   . Breast cancer     Breast Right  . Hypertension     Past Surgical History  Procedure Date  . Combined hysterectomy abdominal w/ a&p repair / oophorectomy 2005  . Resection sternum and ribs 2003  . Biopsy of right breast lymph nodes 1997    Family History  Problem Relation Age of Onset  . Cancer Mother     Breast  . Cancer Father     prostate  . Cancer Maternal Aunt     breast  . Cancer Maternal Grandmother     breast  . Cancer Paternal Grandfather     lung    History   Social History  . Marital Status: Married    Spouse Name: N/A    Number of Children: N/A  . Years of Education: N/A   Occupational History  . Not on file.   Social History Main Topics  . Smoking status: Never Smoker   . Smokeless tobacco: Never Used  . Alcohol Use: No  . Drug Use:  No  . Sexually Active: Not on file   Other Topics Concern  . Not on file   Social History Narrative  . No narrative on file    History  Smoking status  . Never Smoker   Smokeless tobacco  . Never Used    History  Alcohol Use No     Allergies  Allergen Reactions  . Keflex (Cephalexin) Other (See Comments)    Pt does not remember     Current Outpatient Prescriptions  Medication Sig Dispense Refill  . amoxicillin-clavulanate (AUGMENTIN) 875-125 MG per tablet Take 1 tablet by mouth 1 day or 1 dose.      . Calcium Carb-Cholecalciferol (CALTRATE 600+D) 600-800 MG-UNIT TABS Take by mouth 2 (two) times daily.      . capecitabine (XELODA) 500 MG tablet Take by mouth 2 (two) times daily after a meal.      . irbesartan (AVAPRO) 300 MG tablet Take 300 mg by mouth at bedtime.      . lactobacillus acidophilus (BACID) TABS Take 2 tablets by mouth 1 day or 1 dose.       Marland Kitchen LORazepam (ATIVAN) 1 MG tablet Take 1 mg by mouth at bedtime.       Marland Kitchen morphine (KADIAN) 80 MG 24 hr capsule  Take 80 mg by mouth 2 (two) times daily.       Marland Kitchen morphine (MSIR) 15 MG tablet Take 15 mg by mouth 1 day or 1 dose. Takes once daily      . multivitamin (THERAGRAN) per tablet Take 1 tablet by mouth once a week.      Marland Kitchen omeprazole (PRILOSEC) 20 MG capsule Take 20 mg by mouth daily.      . ondansetron (ZOFRAN) 4 MG tablet Take 1 tablet (4 mg total) by mouth every 12 (twelve) hours as needed.  30 tablet  2  . pregabalin (LYRICA) 100 MG capsule Take 100 mg by mouth 2 (two) times daily.      . Zoledronic Acid (ZOMETA IV) Inject into the vein every 30 (thirty) days.      . [DISCONTINUED] pregabalin (LYRICA) 200 MG capsule Take 100 mg by mouth 2 (two) times daily.           Review of Systems:     Cardiac Review of Systems: Y or N  Chest Pain [  n  ]  Resting SOB [  n ] Exertional SOB  [  y]  Orthopnea Milo.Brash  ]   Pedal Edema [   ]    Palpitations n  ] Syncope  [n ]   Presyncope [  n ]  General Review of Systems: [Y] = yes [  ]=no Constitional: recent weight change Cove.Etienne  ]; anorexia [  ]; fatigue [ y ]; nausea [ y ]; night sweats [  ]; fever [n  ]; or chills [ n ];  Dental: poor dentition[  ];   Eye : blurred vision [  ]; diplopia [   ]; vision changes [  ];  Amaurosis fugax[  ]; Resp: cough [  ];  wheezing[  ];  hemoptysis[  ]; shortness of breath[  ]; paroxysmal nocturnal dyspnea[  ]; dyspnea on exertion[  ]; or orthopnea[  ];  GI:  gallstones[  ], vomiting[  ];  dysphagia[  ]; melena[  ];  hematochezia [  ]; heartburn[  ];   Hx of  Colonoscopy[  ]; GU: kidney stones [  ]; hematuria[  ];   dysuria [  ];  nocturia[  ];  history of     obstruction [  ];             Skin: rash, swelling[  ];, hair loss[  ];  peripheral edema[  ];  or itching[  ]; Musculosketetal: myalgias[  ];  joint swelling[  ];  joint erythema[  ];  joint pain[  ];  back pain[  ];  Heme/Lymph:  bruising[  ];  bleeding[  ];  anemia[  ];  Neuro: TIA[  ];  headaches[  ];  stroke[  ];  vertigo[  ];  seizures[  ];   paresthesias[  ];  difficulty walking[  ];  Psych:depression[  ]; anxiety[  ];  Endocrine: diabetes[  ];  thyroid dysfunction[  ];  Immunizations: Flu [  ]; Pneumococcal[  ];  Other:  Physical Exam: BP 119/75  Pulse 85  Resp 18  Ht 5\' 2"  (1.575 m)  Wt 140 lb (63.504 kg)  BMI 25.61 kg/m2  SpO2 98%  General appearance: alert, cooperative and appears stated age Neurologic: intact Heart: regular rate and rhythm, S1, S2 normal, no murmur, click, rub or gallop and normal apical impulse Lungs: clear to auscultation bilaterally and normal percussion bilaterally Abdomen: soft, non-tender; bowel sounds normal; no masses,  no organomegaly Extremities: extremities normal, atraumatic, no cyanosis or edema and Homans sign is negative, no sign of DVT Wound: erythmis upper chest wall midline, patient notes this has been present for year, area lower sternal midline incision with out drainage but with some granulation tissue. No drainage from incision, now mar ly improved wound of sternum less redness looks better then on previous visits  Diagnostic Studies & Laboratory data:     Recent Radiology Findings:   No results found.  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 She Has improved on po antibiotics,   Patient agreeable to  antibiotic suppressive therapy for now.   I Will see back in 6weeks. After ct of chest scheduled at [facility]   Delight Ovens MD  Beeper [phone] Office [phone] 01/18/2012 11:57 AM

## 2012-01-18 NOTE — Patient Instructions (Signed)
No changes

## 2012-01-19 ENCOUNTER — Other Ambulatory Visit: Payer: Self-pay | Admitting: *Deleted

## 2012-01-19 DIAGNOSIS — R110 Nausea: Secondary | ICD-10-CM

## 2012-01-19 DIAGNOSIS — C801 Malignant (primary) neoplasm, unspecified: Secondary | ICD-10-CM

## 2012-01-19 MED ORDER — ONDANSETRON HCL 4 MG PO TABS: 4.0000 mg | ORAL_TABLET | Freq: Two times a day (BID) | ORAL | Status: DC | PRN

## 2012-02-06 ENCOUNTER — Other Ambulatory Visit: Payer: Self-pay | Admitting: *Deleted

## 2012-02-06 NOTE — Telephone Encounter (Signed)
Mrs. Mariah Grimes feels her present antibiotic is causing her intermittent bouts of diarrhea that are becoming progressively longer in duration to resolve.  Also, while on them it interferes with some foods that she wants to eat.  I discussed these issues with Dr. Tyrone Sage and he is changing Augmentin to a low dose Levaquin.  She has been informed and agrees.

## 2012-02-29 ENCOUNTER — Ambulatory Visit (INDEPENDENT_AMBULATORY_CARE_PROVIDER_SITE_OTHER): Payer: Medicare Other | Admitting: Cardiothoracic Surgery

## 2012-02-29 ENCOUNTER — Encounter: Payer: Self-pay | Admitting: Cardiothoracic Surgery

## 2012-02-29 VITALS — BP 127/66 | HR 75 | Resp 16 | Ht 62.0 in | Wt 127.0 lb

## 2012-02-29 DIAGNOSIS — Z792 Long term (current) use of antibiotics: Secondary | ICD-10-CM

## 2012-02-29 DIAGNOSIS — L02219 Cutaneous abscess of trunk, unspecified: Secondary | ICD-10-CM

## 2012-02-29 DIAGNOSIS — L03319 Cellulitis of trunk, unspecified: Secondary | ICD-10-CM

## 2012-02-29 NOTE — Progress Notes (Signed)
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 Edwyna Shell. She returned last week with redness and localized infection at the upper mid sternal area. This is a recurrent process. The same thing happened earlier this spring and she was evaluated with a CT scan which showed some minimal fluid around one of the wires used to reconstruct her chest and she was treated with a course of oral amoxicillin with resolution. She is followed as an outpatient by Dr. Edwyna Shell who last examined the patient in mid June. The patient now returns with recurrent redness and swelling in the same area. She has not been on antibiotics. She denies fever. She was evaluated previously by the Gen. surgery group here in town as well as by her oncologist at [facility]. An aspirate done at that time showed no growth.  She states she has breast cancer which is spread to her bones. Her last CT scan earlier this month at [facility] in early July showed some suspicious areas in the liver but the scan of the chest was fairly unremarkable by report. The images are not available for review. She has had MRI of liver 02/13/2012 with increase in size and new liver lesions consistent with stage 4 Breast CA compared to 11/06/2011   Since last visit she is now on Zeloda,  No fevers or chills, No drainage now Still taking antibiotics once a day Levaquin, had wanted to get off   augmenin  because of upset stomach, Now she does not want to take levaquin  No chest wall pain  Current Activity/ Functional Status: Patient isindependent with mobility/ambulation, transfers, ADL's, IADL's.   Past Medical History  Diagnosis Date  . Asthma   . Breast cancer     Breast Right  . Hypertension     Past Surgical History  Procedure Date  . Combined hysterectomy abdominal w/ a&p repair / oophorectomy 2005  . Resection sternum and ribs 2003  . Biopsy of right breast lymph nodes 1997    Family History  Problem Relation Age of Onset  . Cancer Mother     Breast  . Cancer Father     prostate  . Cancer Maternal Aunt     breast  . Cancer Maternal Grandmother     breast  . Cancer Paternal Grandfather     lung    History   Social History  . Marital Status: Married    Spouse Name: N/A    Number of Children: N/A  . Years of Education:  N/A   Occupational History  . Not on file.   Social History Main Topics  . Smoking status: Never Smoker   . Smokeless tobacco: Never Used  . Alcohol Use: No  . Drug Use: No  . Sexually Active: Not on file   Other Topics Concern  . Not on file   Social History Narrative  . No narrative on file    History  Smoking status  . Never Smoker   Smokeless tobacco  . Never Used    History  Alcohol Use No     Allergies  Allergen Reactions  . Keflex (Cephalexin) Other (See Comments)    Pt does not remember     Current Outpatient Prescriptions  Medication Sig Dispense Refill  . Calcium Carb-Cholecalciferol (CALTRATE 600+D) 600-800 MG-UNIT TABS Take by mouth 2 (two) times daily.      . capecitabine (XELODA) 500 MG tablet Take by mouth 2 (two) times daily after a meal. Take 3 tabs      . irbesartan (AVAPRO) 300 MG tablet Take 300 mg by mouth at bedtime.      . lactobacillus acidophilus (BACID) TABS Take 1 tablet by mouth 1 day or 1 dose.       . levofloxacin (LEVAQUIN) 250 MG tablet Take 250 mg by mouth daily.       Marland Kitchen  LORazepam (ATIVAN) 1 MG tablet Take 1 mg by mouth at bedtime.       Marland Kitchen morphine (KADIAN) 80 MG 24 hr capsule Take 80 mg by mouth 2 (two) times daily.       Marland Kitchen morphine (MSIR) 15 MG tablet Take 15 mg by mouth 1 day or 1 dose. Takes once daily      . multivitamin (THERAGRAN) per tablet Take 1 tablet by mouth once a week.      . ondansetron (ZOFRAN) 4 MG tablet Take 4 mg by mouth every 8 (eight) hours as needed.      . pregabalin (LYRICA) 100 MG capsule Take 100 mg by mouth 2 (two) times daily.      . Zoledronic Acid (ZOMETA IV) Inject into the vein every 30 (thirty) days.           Review of Systems:     Cardiac Review of Systems: Y or N  Chest Pain [  n  ]  Resting SOB [  n ] Exertional SOB  [  y]  Orthopnea Milo.Brash  ]   Pedal Edema [   ]    Palpitations n  ] Syncope  [n ]   Presyncope [  n ]  General Review of Systems: [Y] = yes [  ]=no Constitional: recent weight change Cove.Etienne  ]; anorexia [  ]; fatigue [ y ]; nausea [ y ]; night sweats [  ]; fever [n  ]; or chills [ n ];  Dental: poor dentition[  ];   Eye : blurred vision [  ]; diplopia [   ]; vision changes [  ];  Amaurosis fugax[  ]; Resp: cough [  ];  wheezing[  ];  hemoptysis[  ]; shortness of breath[  ]; paroxysmal nocturnal dyspnea[  ]; dyspnea on exertion[  ]; or orthopnea[  ];  GI:  gallstones[  ], vomiting[  ];  dysphagia[  ]; melena[  ];  hematochezia [  ]; heartburn[  ];   Hx of  Colonoscopy[  ]; GU: kidney stones [  ]; hematuria[  ];   dysuria [  ];  nocturia[  ];  history of     obstruction [  ];             Skin: rash, swelling[  ];, hair loss[  ];  peripheral edema[  ];  or itching[  ]; Musculosketetal: myalgias[  ];  joint swelling[  ];  joint erythema[  ];  joint pain[  ];  back pain[  ];  Heme/Lymph: bruising[  ];  bleeding[  ];  anemia[  ];  Neuro: TIA[  ];  headaches[  ];  stroke[  ];  vertigo[   ];  seizures[  ];   paresthesias[  ];  difficulty walking[  ];  Psych:depression[  ]; anxiety[  ];  Endocrine: diabetes[  ];  thyroid dysfunction[  ];  Immunizations: Flu [  ]; Pneumococcal[  ];  Other:  Physical Exam: BP 127/66  Pulse 75  Resp 16  Ht 5\' 2"  (1.575 m)  Wt 127 lb (57.607 kg)  BMI 23.23 kg/m2  SpO2 93%  General appearance: alert, cooperative and appears stated age Neurologic: intact Heart: regular rate and rhythm, S1, S2 normal, no murmur, click, rub or gallop and normal apical impulse Lungs: clear to auscultation bilaterally and normal percussion bilaterally Abdomen: soft, non-tender; bowel sounds normal; no masses,  no organomegaly Extremities: extremities normal, atraumatic, no cyanosis or edema and Homans sign is negative, no sign of DVT Wound:very mild  erythema  upper chest wall midline, patient notes this has been present for year, area lower sternal midline incision with out drainage. Now completely healed No drainage from incision  Diagnostic Studies & Laboratory data:     Recent Radiology Findings:   recent MRI of liver at [facility]- shows progression of metastatic disease  Recent Lab Findings: Lab Results  Component Value Date   WBC 6.4 04/05/2007   HGB 11.1* 04/05/2007   HCT 32.4* 04/05/2007   PLT 204 04/05/2007   GLUCOSE 107* 04/05/2007   ALT 21 04/01/2007   AST 31 04/01/2007   NA 140 04/05/2007   K 3.5 04/05/2007   CL 111 04/05/2007   CREATININE 0.53 04/05/2007   BUN 1* 04/05/2007   CO2 24 04/05/2007   INR 1.4 04/03/2007      Assessment / Plan:      Patient with question of recurrent metastatic breast CA and 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 She Has improved on po antibiotics. Augmentin was causing upset stomach, Started Levaquin but patient did not like because of side effects said made her dizzy.  I have recommended stopping antibiotics for now and monitor  wound.   I Will see back in 8 weeks, she will call immediately if any change in chest wound   Delight Ovens MD  Beeper [phone] Office [phone] 02/29/2012 1:47 PM

## 2012-02-29 NOTE — Patient Instructions (Addendum)
Stop Levaquin, call if recurrent symptoms of pain or redness  Return 8 weeks

## 2012-04-11 ENCOUNTER — Ambulatory Visit (INDEPENDENT_AMBULATORY_CARE_PROVIDER_SITE_OTHER): Payer: Medicare Other | Admitting: Cardiothoracic Surgery

## 2012-04-11 ENCOUNTER — Encounter: Payer: Self-pay | Admitting: Cardiothoracic Surgery

## 2012-04-11 VITALS — BP 133/70 | HR 89 | Resp 20 | Ht 62.0 in | Wt 126.0 lb

## 2012-04-11 DIAGNOSIS — L02219 Cutaneous abscess of trunk, unspecified: Secondary | ICD-10-CM

## 2012-04-11 DIAGNOSIS — C7951 Secondary malignant neoplasm of bone: Secondary | ICD-10-CM

## 2012-04-11 DIAGNOSIS — C50919 Malignant neoplasm of unspecified site of unspecified female breast: Secondary | ICD-10-CM

## 2012-04-11 MED ORDER — LEVOFLOXACIN 500 MG PO TABS: 500.0000 mg | ORAL_TABLET | Freq: Every day | ORAL | Status: DC

## 2012-04-11 MED ORDER — ONDANSETRON HCL 4 MG PO TABS: 4.0000 mg | ORAL_TABLET | Freq: Three times a day (TID) | ORAL | Status: DC | PRN

## 2012-04-11 NOTE — Progress Notes (Signed)
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 Edwyna Shell. She returned last week with redness and localized infection at the upper mid sternal area. This is a recurrent process. The same thing happened earlier this spring and she was evaluated with a CT scan which showed some minimal fluid around one of the wires used to reconstruct her chest and she was treated with a course of oral amoxicillin with resolution. She is followed as an outpatient by Dr. Edwyna Shell who last examined the patient in mid June. The patient now returns with recurrent redness and swelling in the same area. She has not been on antibiotics. She denies fever. She was evaluated previously by the Gen. surgery group here in town as well as by her oncologist at [facility]. An aspirate done at that time showed no growth.  She states she has breast cancer which is spread to her bones. Her last CT scan earlier this month at [facility] in early July showed some suspicious areas in the liver but the scan of the chest was fairly unremarkable by report. The images are not available for review. She has had MRI of liver 02/13/2012 with increase in size and new liver lesions consistent with stage 4 Breast CA compared to 11/06/2011   Since last visit she is now on Zeloda,   Current Activity/ Functional Status: Patient isindependent with mobility/ambulation, transfers, ADL's, IADL's.     Past Medical History  Diagnosis Date  . Asthma   . Breast cancer     Breast Right  . Hypertension     Past Surgical History  Procedure Laterality Date  . Combined hysterectomy abdominal w/ a&p repair / oophorectomy  2005  . Resection sternum and ribs  2003  . Biopsy of right breast lymph nodes  1997    Family History  Problem Relation Age of Onset  . Cancer Mother     Breast  . Cancer Father     prostate  . Cancer Maternal Aunt     breast  . Cancer Maternal Grandmother     breast  . Cancer Paternal Grandfather     lung    History   Social History  . Marital Status: Married    Spouse Name: N/A    Number of Children: N/A  . Years of Education: N/A   Occupational History  . Not on file.   Social History Main Topics  . Smoking status: Never Smoker   . Smokeless tobacco: Never Used  . Alcohol Use: No  . Drug Use: No  . Sexually Active: Not on file   Other Topics Concern  . Not on file   Social  History Narrative  . No narrative on file    History  Smoking status  . Never Smoker   Smokeless tobacco  . Never Used    History  Alcohol Use No     Allergies  Allergen Reactions  . Keflex (Cephalexin) Other (See Comments)    Pt does not remember     Current Outpatient Prescriptions  Medication Sig Dispense Refill  . Calcium Carb-Cholecalciferol (CALTRATE 600+D) 600-800 MG-UNIT TABS Take by mouth 2 (two) times daily.      . capecitabine (XELODA) 500 MG tablet Take by mouth 2 (two) times daily after a meal. Take 3 tabs      . irbesartan (AVAPRO) 300 MG tablet Take 300 mg by mouth at bedtime.      . lactobacillus acidophilus (BACID) TABS Take 1 tablet by mouth 1 day or 1 dose.       Marland Kitchen LORazepam (ATIVAN) 1 MG tablet Take 1 mg by mouth at bedtime.       Marland Kitchen morphine (KADIAN) 80 MG 24 hr capsule Take 80 mg by mouth 2 (two) times daily.       Marland Kitchen morphine (MSIR) 15 MG tablet Take 15 mg by mouth 1 day or 1 dose. Takes once daily      . multivitamin (THERAGRAN)  per tablet Take 1 tablet by mouth once a week.      . ondansetron (ZOFRAN) 4 MG tablet Take 4 mg by mouth every 8 (eight) hours as needed.      . pregabalin (LYRICA) 100 MG capsule Take 100 mg by mouth 2 (two) times daily.      . Zoledronic Acid (ZOMETA IV) Inject into the vein every 30 (thirty) days.       No current facility-administered medications for this visit.       Review of Systems:     Cardiac Review of Systems: Y or N  Chest Pain [  n  ]  Resting SOB [  n ] Exertional SOB  [  y]  Orthopnea Milo.Brash  ]   Pedal Edema [   ]    Palpitations n  ] Syncope  [n ]   Presyncope [  n ]  General Review of Systems: [Y] = yes [  ]=no Constitional: recent weight change Cove.Etienne  ]; anorexia [  ]; fatigue [ y ]; nausea [ y ]; night sweats [  ]; fever [n  ]; or chills [ n ];                                                                                                                                          Dental: poor dentition[  ];   Eye : blurred vision [  ]; diplopia [   ]; vision changes [  ];  Amaurosis fugax[  ]; Resp: cough [  ];  wheezing[  ];  hemoptysis[  ];  shortness of breath[  ]; paroxysmal nocturnal dyspnea[  ]; dyspnea on exertion[  ]; or orthopnea[  ];  GI:  gallstones[  ], vomiting[  ];  dysphagia[  ]; melena[  ];  hematochezia [  ]; heartburn[  ];   Hx of  Colonoscopy[  ]; GU: kidney stones [  ]; hematuria[  ];   dysuria [  ];  nocturia[  ];  history of     obstruction [  ];             Skin: rash, swelling[  ];, hair loss[  ];  peripheral edema[  ];  or itching[  ]; Musculosketetal: myalgias[  ];  joint swelling[  ];  joint erythema[  ];  joint pain[  ];  back pain[  ];  Heme/Lymph: bruising[  ];  bleeding[  ];  anemia[  ];  Neuro: TIA[  ];  headaches[  ];  stroke[  ];  vertigo[  ];  seizures[  ];   paresthesias[  ];  difficulty walking[  ];  Psych:depression[  ]; anxiety[  ];  Endocrine: diabetes[  ];  thyroid dysfunction[  ];  Immunizations: Flu [  ]; Pneumococcal[   ];  Other:  Physical Exam: There were no vitals taken for this visit.  General appearance: alert, cooperative and appears stated age Neurologic: intact Heart: regular rate and rhythm, S1, S2 normal, no murmur, click, rub or gallop and normal apical impulse Lungs: clear to auscultation bilaterally and normal percussion bilaterally Abdomen: soft, non-tender; bowel sounds normal; no masses,  no organomegaly Extremities: extremities normal, atraumatic, no cyanosis or edema and Homans sign is negative, no sign of DVT Wound:very mild  erythema  upper chest wall midline, patient notes this has been present for year, area lower sternal midline incision with out drainage. Now completely healed No drainage from incision    Diagnostic Studies & Laboratory data:     Recent Radiology Findings:   recent MRI of liver at [facility]- shows progression of metastatic disease  Recent Lab Findings: Lab Results  Component Value Date   WBC 6.4 04/05/2007   HGB 11.1* 04/05/2007   HCT 32.4* 04/05/2007   PLT 204 04/05/2007   GLUCOSE 107* 04/05/2007   ALT 21 04/01/2007   AST 31 04/01/2007   NA 140 04/05/2007   K 3.5 04/05/2007   CL 111 04/05/2007   CREATININE 0.53 04/05/2007   BUN 1* 04/05/2007   CO2 24 04/05/2007   INR 1.4 04/03/2007      Assessment / Plan:      Patient with question of recurrent metastatic breast CA and 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 She Has improved on po antibiotics. Augmentin was causing upset stomach, she is currently on Levaquin.   I Will see back in 3 months, she will call immediately if any change in chest wound   Delight Ovens MD  Beeper [phone] Office [phone] 04/11/2012 11:16 AM

## 2012-04-12 ENCOUNTER — Other Ambulatory Visit: Payer: Self-pay

## 2012-04-12 NOTE — Telephone Encounter (Signed)
Pt's RX for Levaquin was changed to 250 mg po every day. [place] was notified and pt also.

## 2012-04-25 ENCOUNTER — Ambulatory Visit: Payer: Medicare Other | Admitting: Cardiothoracic Surgery

## 2012-05-07 ENCOUNTER — Other Ambulatory Visit: Payer: Self-pay | Admitting: *Deleted

## 2012-05-07 DIAGNOSIS — L0390 Cellulitis, unspecified: Secondary | ICD-10-CM

## 2012-05-07 MED ORDER — LEVOFLOXACIN 500 MG PO TABS: 250.0000 mg | ORAL_TABLET | Freq: Every day | ORAL | Status: DC

## 2012-05-08 DIAGNOSIS — C7951 Secondary malignant neoplasm of bone: Secondary | ICD-10-CM | POA: Insufficient documentation

## 2012-06-07 ENCOUNTER — Telehealth: Payer: Self-pay | Admitting: *Deleted

## 2012-06-07 NOTE — Telephone Encounter (Signed)
She wants Dr. Tyrone Sage to know that the chest mass is now purple in color and has scaling skin around it.  It is not draining.  She saw her oncologist at [facility] and she doubled the antibiotic that Dr. Tyrone Sage had prescribed.  She wanted to be sure that he knew and agreed with that.  I consulted him and he agreed.  He said he was not too alarmed with the discoloration.  If it starts to drain, please let us know and we will see her.  She understands.

## 2012-06-17 ENCOUNTER — Ambulatory Visit (INDEPENDENT_AMBULATORY_CARE_PROVIDER_SITE_OTHER): Payer: Medicare Other | Admitting: Cardiothoracic Surgery

## 2012-06-17 VITALS — BP 151/85 | HR 97 | Resp 16 | Ht 62.0 in | Wt 126.0 lb

## 2012-06-17 DIAGNOSIS — C7952 Secondary malignant neoplasm of bone marrow: Secondary | ICD-10-CM

## 2012-06-17 DIAGNOSIS — C7951 Secondary malignant neoplasm of bone: Secondary | ICD-10-CM

## 2012-06-17 DIAGNOSIS — C50919 Malignant neoplasm of unspecified site of unspecified female breast: Secondary | ICD-10-CM

## 2012-06-17 DIAGNOSIS — Z09 Encounter for follow-up examination after completed treatment for conditions other than malignant neoplasm: Secondary | ICD-10-CM

## 2012-06-17 DIAGNOSIS — Z792 Long term (current) use of antibiotics: Secondary | ICD-10-CM

## 2012-06-17 DIAGNOSIS — L03319 Cellulitis of trunk, unspecified: Secondary | ICD-10-CM

## 2012-06-17 DIAGNOSIS — L02219 Cutaneous abscess of trunk, unspecified: Secondary | ICD-10-CM

## 2012-06-17 NOTE — Progress Notes (Signed)
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 Edwyna Shell. She returned last week with redness and localized infection at the upper mid sternal area. This is a recurrent process. The same thing happened earlier this spring and she was evaluated with a CT scan which showed some minimal fluid around one of the wires used to reconstruct her chest and she was treated with a course of oral amoxicillin with resolution. She is followed as an outpatient by Dr. Edwyna Shell who last examined the patient in mid June. The patient now returns with recurrent redness and swelling in the same area. She has not been on antibiotics. She denies fever. She was evaluated previously by the Gen. surgery group here in town as well as by her oncologist at [facility]. An aspirate done at that time showed no growth.  She states she has breast cancer which is spread to her bones. Her last CT scan earlier this month at [facility] in early July showed some suspicious areas in the liver but the scan of the chest was fairly unremarkable by report. The images are not available for review. She has had MRI of liver 02/13/2012 with increase in size and new liver lesions consistent with stage 4 Breast CA compared to 11/06/2011   Since last visit she is now been started on exemestane and everolimus  She comes into the office today because of that area in the mid sternum of  increased swelling and erythema and question of a "pustule". He   Current Activity/ Functional Status: Patient isindependent with mobility/ambulation, transfers, ADL's, IADL's.   Past Medical History  Diagnosis Date  . Asthma   . Breast cancer     Breast Right  . Hypertension     Past Surgical History  Procedure Laterality Date  . Combined hysterectomy abdominal w/ a&p repair / oophorectomy  2005  . Resection sternum and ribs  2003  . Biopsy of right breast lymph nodes  1997    Family History  Problem Relation Age of Onset  . Cancer Mother     Breast  . Cancer Father     prostate  . Cancer Maternal Aunt     breast  . Cancer Maternal Grandmother     breast  . Cancer Paternal Grandfather     lung    History   Social History  . Marital Status: Married    Spouse Name: N/A    Number of Children: N/A  . Years of Education: N/A   Occupational History  . Not on file.   Social History Main Topics  . Smoking status: Never Smoker   . Smokeless tobacco:  Never Used  . Alcohol Use: No  . Drug Use: No  . Sexually Active: Not on file   Other Topics Concern  . Not on file   Social History Narrative  . No narrative on file    History  Smoking status  . Never Smoker   Smokeless tobacco  . Never Used    History  Alcohol Use No     Allergies  Allergen Reactions  . Keflex (Cephalexin) Other (See Comments)    Pt does not remember   . Methadone   . Oxycodone     Hot flashes    Current Outpatient Prescriptions  Medication Sig Dispense Refill  . amoxicillin-clavulanate (AUGMENTIN) 875-125 MG per tablet Take 1 tablet by mouth daily.      . Calcium Carb-Cholecalciferol (CALTRATE 600+D) 600-800 MG-UNIT TABS Take by mouth 2 (two) times daily.      Marland Kitchen exemestane (AROMASIN) 25 MG tablet Take 25 mg by mouth daily after breakfast.      . irbesartan (AVAPRO) 300 MG tablet Take 300 mg by mouth at bedtime.      . lactobacillus acidophilus (BACID) TABS Take 1 tablet by  mouth 1 day or 1 dose.       Marland Kitchen LORazepam (ATIVAN) 1 MG tablet Take 1 mg by mouth at bedtime.       Marland Kitchen morphine (KADIAN) 80 MG 24 hr capsule Take 80 mg by mouth 2 (two) times daily.       Marland Kitchen morphine (MSIR) 15 MG tablet Take 15 mg by mouth 1 day or 1 dose. Takes once daily      . multivitamin (THERAGRAN) per tablet Take 1 tablet by mouth once a week.      . ondansetron (ZOFRAN) 4 MG tablet Take 1 tablet (4 mg total) by mouth every 8 (eight) hours as needed.  20 tablet  1  . pregabalin (LYRICA) 100 MG capsule Take 100 mg by mouth 2 (two) times daily. Takes 150 mgms      . Zoledronic Acid (ZOMETA IV) Inject into the vein every 30 (thirty) days.       No current facility-administered medications for this visit.       Review of Systems:     Cardiac Review of Systems: Y or N  Chest Pain [  n  ]  Resting SOB [  n ] Exertional SOB  [  y]  Orthopnea Milo.Brash  ]   Pedal Edema [   ]    Palpitations n  ] Syncope  [n ]   Presyncope [  n ]  General Review of Systems: [Y] = yes [  ]=no Constitional: recent weight change Cove.Etienne  ]; anorexia [  ]; fatigue [ y ]; nausea [ y ]; night sweats [  ]; fever [n  ]; or chills [ n ];  Dental: poor dentition[  ];   Eye : blurred vision [  ]; diplopia [   ]; vision changes [  ];  Amaurosis fugax[  ]; Resp: cough [  ];  wheezing[  ];  hemoptysis[  ]; shortness of breath[  ]; paroxysmal nocturnal dyspnea[  ]; dyspnea on exertion[  ]; or orthopnea[  ];  GI:  gallstones[  ], vomiting[  ];  dysphagia[  ]; melena[  ];  hematochezia [  ]; heartburn[  ];   Hx of  Colonoscopy[  ]; GU: kidney stones [  ]; hematuria[  ];   dysuria [  ];  nocturia[  ];  history of     obstruction [  ];             Skin: rash, swelling[  ];, hair loss[  ];  peripheral edema[  ];  or itching[  ]; Musculosketetal: myalgias[  ];  joint swelling[  ];  joint erythema[  ];  joint  pain[  ];  back pain[  ];  Heme/Lymph: bruising[  ];  bleeding[  ];  anemia[  ];  Neuro: TIA[  ];  headaches[  ];  stroke[  ];  vertigo[  ];  seizures[  ];   paresthesias[  ];  difficulty walking[  ];  Psych:depression[  ]; anxiety[  ];  Endocrine: diabetes[  ];  thyroid dysfunction[  ];  Immunizations: Flu [?  ]; Pneumococcal[?  ];  Other:  Physical Exam: BP 151/85  Pulse 97  Resp 16  Ht 5\' 2"  (1.575 m)  Wt 126 lb (57.153 kg)  BMI 23.04 kg/m2  SpO2 98%  General appearance: alert, cooperative and appears stated age Neurologic: intact Heart: regular rate and rhythm, S1, S2 normal, no murmur, click, rub or gallop and normal apical impulse Lungs: clear to auscultation bilaterally and normal percussion bilaterally Abdomen: soft, non-tender; bowel sounds normal; no masses,  no organomegaly Extremities: extremities normal, atraumatic, no cyanosis or edema and Homans sign is negative, no sign of DVT Wound:very mild  erythema  upper chest wall midline, patient notes this has been present for year, area lower sternal midline incision with out drainage. Now completely healed Patient's sternal wound has a dime size area of purplish swelling that is fluctuant there is no drainage.    Diagnostic Studies & Laboratory data:     Recent Radiology Findings:   recent MRI of liver at [facility]- shows progression of metastatic disease  Recent Lab Findings: Lab Results  Component Value Date   WBC 6.4 04/05/2007   HGB 11.1* 04/05/2007   HCT 32.4* 04/05/2007   PLT 204 04/05/2007   GLUCOSE 107* 04/05/2007   ALT 21 04/01/2007   AST 31 04/01/2007   NA 140 04/05/2007   K 3.5 04/05/2007   CL 111 04/05/2007   CREATININE 0.53 04/05/2007   BUN 1* 04/05/2007   CO2 24 04/05/2007   INR 1.4 04/03/2007      Assessment / Plan:     Patient with known stage IV breast cancer who's been on antibiotic suppression therapy for question of indolent infection of prosthetic material placed in her chest or chest wall  resection and reconstruction years ago.   In the office today the area in question in the midsternum was prepped and 1% lidocaine was infiltrated in the area in the area was lanced. Cultures were taken in addition a small amount of tissue was submitted to pathology to see if this process may be related to her underlying  metastatic cancer. She will continue on the same antibiotics pending culture results. She will return to the office in 2 days for followup and to review the results. She was instructed in local wound care with a small wick drain.  Delight Ovens MD  Beeper [phone] Office [phone] 06/17/2012 4:48 PM

## 2012-06-18 ENCOUNTER — Other Ambulatory Visit: Payer: Self-pay | Admitting: Cardiothoracic Surgery

## 2012-06-19 ENCOUNTER — Other Ambulatory Visit: Payer: Self-pay | Admitting: *Deleted

## 2012-06-20 ENCOUNTER — Other Ambulatory Visit (HOSPITAL_COMMUNITY)
Admission: RE | Admit: 2012-06-20 | Discharge: 2012-06-20 | Disposition: A | Discharge status: Home / Self Care | Payer: Medicare Other | Source: Ambulatory Visit | Attending: Cardiothoracic Surgery | Admitting: Cardiothoracic Surgery

## 2012-06-20 ENCOUNTER — Encounter: Payer: Self-pay | Admitting: Cardiothoracic Surgery

## 2012-06-20 ENCOUNTER — Ambulatory Visit (INDEPENDENT_AMBULATORY_CARE_PROVIDER_SITE_OTHER): Payer: Medicare Other | Admitting: Cardiothoracic Surgery

## 2012-06-20 VITALS — BP 107/67 | HR 88 | Resp 16 | Ht 62.0 in | Wt 126.0 lb

## 2012-06-20 DIAGNOSIS — S298XXA Other specified injuries of thorax, initial encounter: Secondary | ICD-10-CM | POA: Insufficient documentation

## 2012-06-20 DIAGNOSIS — X58XXXA Exposure to other specified factors, initial encounter: Secondary | ICD-10-CM | POA: Insufficient documentation

## 2012-06-20 DIAGNOSIS — IMO0001 Reserved for inherently not codable concepts without codable children: Secondary | ICD-10-CM

## 2012-06-20 DIAGNOSIS — C7951 Secondary malignant neoplasm of bone: Secondary | ICD-10-CM

## 2012-06-20 DIAGNOSIS — C7952 Secondary malignant neoplasm of bone marrow: Secondary | ICD-10-CM

## 2012-06-20 DIAGNOSIS — Z792 Long term (current) use of antibiotics: Secondary | ICD-10-CM

## 2012-06-20 DIAGNOSIS — C50919 Malignant neoplasm of unspecified site of unspecified female breast: Secondary | ICD-10-CM

## 2012-06-20 DIAGNOSIS — R222 Localized swelling, mass and lump, trunk: Secondary | ICD-10-CM | POA: Insufficient documentation

## 2012-06-20 DIAGNOSIS — Z4800 Encounter for change or removal of nonsurgical wound dressing: Secondary | ICD-10-CM

## 2012-06-20 DIAGNOSIS — L03319 Cellulitis of trunk, unspecified: Secondary | ICD-10-CM

## 2012-06-20 DIAGNOSIS — L02219 Cutaneous abscess of trunk, unspecified: Secondary | ICD-10-CM

## 2012-06-20 DIAGNOSIS — Z09 Encounter for follow-up examination after completed treatment for conditions other than malignant neoplasm: Secondary | ICD-10-CM

## 2012-06-20 LAB — CULTURE, ROUTINE-ABSCESS
Gram Stain: NONE SEEN
Gram Stain: NONE SEEN
Gram Stain: NONE SEEN
Organism ID, Bacteria: NO GROWTH

## 2012-06-20 MED ORDER — AMOXICILLIN-POT CLAVULANATE 875-125 MG PO TABS: 1.0000 | ORAL_TABLET | Freq: Every day | ORAL | Status: DC

## 2012-06-20 NOTE — Progress Notes (Signed)
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 Edwyna Shell. She returned last week with redness and localized infection at the upper mid sternal area. This is a recurrent process. The same thing happened earlier this spring and she was evaluated with a CT scan which showed some minimal fluid around one of the wires used to reconstruct her chest and she was treated with a course of oral amoxicillin with resolution. She is followed as an outpatient by Dr. Edwyna Shell who last examined the patient in mid June. The patient now returns with recurrent redness and swelling in the same area. She has not been on antibiotics. She denies fever. She was evaluated previously by the Gen. surgery group here in town as well as by her oncologist at [facility]. An aspirate done at that time showed no growth.  She states she has breast cancer which is spread to her bones. Her last CT scan earlier this month at [facility] in early July showed some suspicious areas in the liver but the scan of the chest was fairly unremarkable by report. The images are not available for review. She has had MRI of liver 02/13/2012 with increase in size and new liver lesions consistent with stage 4 Breast CA compared to 11/06/2011   Since last visit she is now been started on exemestane and everolimus  She comes into the office today because of that area in the mid sternum of  increased swelling and erythema and question of a "pustule". Threee days ago the area was opened and cultured and tissuebx done. No growth so far and path negative   Current Activity/ Functional Status: Patient isindependent with mobility/ambulation, transfers, ADL's, IADL's.   Past Medical History  Diagnosis Date  . Asthma   . Breast cancer     Breast Right  . Hypertension     Past Surgical History  Procedure Laterality Date  . Combined hysterectomy abdominal w/ a&p repair / oophorectomy  2005  . Resection sternum and ribs  2003  . Biopsy of right breast lymph nodes  1997    Family History  Problem Relation Age of Onset  . Cancer Mother     Breast  . Cancer Father     prostate  . Cancer Maternal Aunt     breast  . Cancer Maternal Grandmother     breast  . Cancer Paternal Grandfather     lung    History   Social History  . Marital Status: Married    Spouse Name: N/A    Number of Children: N/A  . Years of Education: N/A   Occupational History  . Not on  file.   Social History Main Topics  . Smoking status: Never Smoker   . Smokeless tobacco: Never Used  . Alcohol Use: No  . Drug Use: No  . Sexually Active: Not on file   Other Topics Concern  . Not on file   Social History Narrative  . No narrative on file    History  Smoking status  . Never Smoker   Smokeless tobacco  . Never Used    History  Alcohol Use No     Allergies  Allergen Reactions  . Keflex (Cephalexin) Other (See Comments)    Pt does not remember   . Methadone   . Oxycodone     Hot flashes    Current Outpatient Prescriptions  Medication Sig Dispense Refill  . amoxicillin-clavulanate (AUGMENTIN) 875-125 MG per tablet Take 1 tablet by mouth daily.      . Calcium Carb-Cholecalciferol (CALTRATE 600+D) 600-800 MG-UNIT TABS Take by mouth 2 (two) times daily.      . Calcium-Magnesium-Vitamin D 400-166.7-133.3 MG-MG-UNIT TABS 1 tablet. Take 1 tablet by mouth daily.      Marland Kitchen  everolimus (AFINITOR) 5 MG tablet 5 mg. Take 5 mg by mouth daily.      Marland Kitchen exemestane (AROMASIN) 25 MG tablet Take 25 mg by mouth daily after breakfast.      . irbesartan (AVAPRO) 300 MG tablet Take 300 mg by mouth at bedtime.      . lactobacillus acidophilus (BACID) TABS Take 1 tablet by mouth 1 day or 1 dose.       Marland Kitchen LORazepam (ATIVAN) 1 MG tablet Take 1 mg by mouth at bedtime.       Marland Kitchen morphine (KADIAN) 80 MG 24 hr capsule Take 80 mg by mouth 2 (two) times daily.       Marland Kitchen morphine (MSIR) 15 MG tablet Take 15 mg by mouth 1 day or 1 dose. Takes once daily      . multivitamin (THERAGRAN) per tablet Take 1 tablet by mouth once a week.      . ondansetron (ZOFRAN) 4 MG tablet Take 1 tablet (4 mg total) by mouth every 8 (eight) hours as needed.  20 tablet  1  . pregabalin (LYRICA) 100 MG capsule Take 100 mg by mouth 2 (two) times daily. Takes 150 mgms      . Zoledronic Acid (ZOMETA IV) Inject into the vein every 30 (thirty) days.       No current facility-administered medications for this visit.       Review of Systems:     Cardiac Review of Systems: Y or N  Chest Pain [  n  ]  Resting SOB [  n ] Exertional SOB  [  y]  Orthopnea Milo.Brash  ]   Pedal Edema [   ]    Palpitations n  ] Syncope  [n ]   Presyncope [  n ]  General Review of Systems: [Y] = yes [  ]=no Constitional: recent weight change Cove.Etienne  ]; anorexia [  ]; fatigue [ y ]; nausea [ y ]; night sweats [  ]; fever [n  ]; or chills [ n ];  Dental: poor dentition[  ];   Eye : blurred vision [  ]; diplopia [   ]; vision changes [  ];  Amaurosis fugax[  ]; Resp: cough [  ];  wheezing[  ];  hemoptysis[  ]; shortness of breath[  ]; paroxysmal nocturnal dyspnea[  ]; dyspnea on exertion[  ]; or orthopnea[  ];  GI:  gallstones[  ], vomiting[  ];  dysphagia[  ]; melena[  ];  hematochezia [  ]; heartburn[  ];   Hx of   Colonoscopy[  ]; GU: kidney stones [  ]; hematuria[  ];   dysuria [  ];  nocturia[  ];  history of     obstruction [  ];             Skin: rash, swelling[  ];, hair loss[  ];  peripheral edema[  ];  or itching[  ]; Musculosketetal: myalgias[  ];  joint swelling[  ];  joint erythema[  ];  joint pain[  ];  back pain[  ];  Heme/Lymph: bruising[  ];  bleeding[  ];  anemia[  ];  Neuro: TIA[  ];  headaches[  ];  stroke[  ];  vertigo[  ];  seizures[  ];   paresthesias[  ];  difficulty walking[  ];  Psych:depression[  ]; anxiety[  ];  Endocrine: diabetes[  ];  thyroid dysfunction[  ];  Immunizations: Flu [?  ]; Pneumococcal[?  ];  Other:  Physical Exam: BP 107/67  Pulse 88  Resp 16  Ht 5\' 2"  (1.575 m)  Wt 126 lb (57.153 kg)  BMI 23.04 kg/m2  SpO2 98%  General appearance: alert, cooperative and appears stated age Neurologic: intact Heart: regular rate and rhythm, S1, S2 normal, no murmur, click, rub or gallop and normal apical impulse Lungs: clear to auscultation bilaterally and normal percussion bilaterally Abdomen: soft, non-tender; bowel sounds normal; no masses,  no organomegaly Extremities: extremities normal, atraumatic, no cyanosis or edema and Homans sign is negative, no sign of DVT Wound: Patient's sternal wound has a dime size area of purplish swelling that is fluctuant there is no drainage.   Wound debridement today: Area still with some "soft " tissue , full thickness area debrided proximally dime (less then 20cm sq cm) size further tissue was sent to pathology.  Diagnostic Studies & Laboratory data:     Recent Radiology Findings:   recent MRI of liver at [facility]- shows progression of metastatic disease  Recent Lab Findings: Lab Results  Component Value Date   WBC 6.4 04/05/2007   HGB 11.1* 04/05/2007   HCT 32.4* 04/05/2007   PLT 204 04/05/2007   GLUCOSE 107* 04/05/2007   ALT 21 04/01/2007   AST 31 04/01/2007   NA 140 04/05/2007   K 3.5 04/05/2007   CL 111 04/05/2007    CREATININE 0.53 04/05/2007   BUN 1* 04/05/2007   CO2 24 04/05/2007   INR 1.4 04/03/2007      Assessment / Plan:     Patient with known stage IV breast cancer who's been on antibiotic suppression therapy for question of indolent infection of prosthetic material placed in her chest or chest wall resection and reconstruction years ago.  Will continue with local wound care, so far no positive histology or culture of area, but concern of necrosis in area of previous radiation and chest wall reconstruction.  Will see back in one week , nurse wound check tomorrow.   Delight Ovens MD  Beeper [phone] Office  [phone] 06/20/2012 2:56 PM

## 2012-06-21 ENCOUNTER — Encounter (INDEPENDENT_AMBULATORY_CARE_PROVIDER_SITE_OTHER): Payer: Medicare Other

## 2012-06-21 DIAGNOSIS — J90 Pleural effusion, not elsewhere classified: Secondary | ICD-10-CM

## 2012-06-25 ENCOUNTER — Other Ambulatory Visit: Payer: Self-pay | Admitting: *Deleted

## 2012-06-25 DIAGNOSIS — L03319 Cellulitis of trunk, unspecified: Secondary | ICD-10-CM

## 2012-06-27 ENCOUNTER — Encounter: Payer: Self-pay | Admitting: Cardiothoracic Surgery

## 2012-06-27 ENCOUNTER — Ambulatory Visit (INDEPENDENT_AMBULATORY_CARE_PROVIDER_SITE_OTHER): Payer: Medicare Other | Admitting: Cardiothoracic Surgery

## 2012-06-27 VITALS — BP 115/75 | HR 92 | Resp 16 | Ht 62.0 in | Wt 126.0 lb

## 2012-06-27 DIAGNOSIS — IMO0001 Reserved for inherently not codable concepts without codable children: Secondary | ICD-10-CM

## 2012-06-27 DIAGNOSIS — L02213 Cutaneous abscess of chest wall: Secondary | ICD-10-CM

## 2012-06-27 DIAGNOSIS — L02219 Cutaneous abscess of trunk, unspecified: Secondary | ICD-10-CM

## 2012-06-27 DIAGNOSIS — Z4800 Encounter for change or removal of nonsurgical wound dressing: Secondary | ICD-10-CM

## 2012-06-27 NOTE — Progress Notes (Signed)
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 Edwyna Shell. She returned last week with redness and localized infection at the upper mid sternal area. This is a recurrent process. The same thing happened earlier this spring and she was evaluated with a CT scan which showed some minimal fluid around one of the wires used to reconstruct her chest and she was treated with a course of oral amoxicillin with resolution. She is followed as an outpatient by Dr. Edwyna Shell who last examined the patient in mid June. The patient now returns with recurrent redness and swelling in the same area. She has not been on antibiotics. She denies fever. She was evaluated previously by the [facility] surgery group here in town as well as by her oncologist at [facility]. An aspirate done at that time showed no growth.  She states she has breast cancer which is spread to her bones. Her last CT scan earlier this month at [facility] in early July showed some suspicious areas in the liver but the scan of the chest was fairly unremarkable by report. The images are not available for review. She has had MRI of liver 02/13/2012 with increase in size and new liver lesions consistent with stage 4 Breast CA compared to 11/06/2011   Since last visit she is now been started on exemestane and everolimus  She comes into the office today because of that area in the mid sternum of  increased swelling and erythema and question of a "pustule". Last week the area was opened and cultured and tissuebx done. No growth so far and path negative so far. With local wound care both here in the office and by the patient the area has improved   Current Activity/ Functional Status: Patient isindependent with mobility/ambulation, transfers, ADL's, IADL's.   Past Medical History  Diagnosis Date  . Asthma   . Breast cancer     Breast Right  . Hypertension     Past Surgical History  Procedure Laterality Date  . Combined hysterectomy abdominal w/ a&p repair / oophorectomy  2005  . Resection sternum and ribs  2003  . Biopsy of right breast lymph nodes  1997    Family History  Problem Relation Age of Onset  . Cancer Mother     Breast  . Cancer Father     prostate  . Cancer Maternal Aunt     breast  . Cancer Maternal Grandmother     breast  . Cancer Paternal Grandfather     lung    History   Social History  . Marital Status: Married    Spouse Name: N/A  Number of Children: N/A  . Years of Education: N/A   Occupational History  . Not on file.   Social History Main Topics  . Smoking status: Never Smoker   . Smokeless tobacco: Never Used  . Alcohol Use: No  . Drug Use: No  . Sexually Active: Not on file   Other Topics Concern  . Not on file   Social History Narrative  . No narrative on file    History  Smoking status  . Never Smoker   Smokeless tobacco  . Never Used    History  Alcohol Use No     Allergies  Allergen Reactions  . Keflex (Cephalexin) Other (See Comments)    Pt does not remember   . Methadone   . Oxycodone     Hot flashes    Current Outpatient Prescriptions  Medication Sig Dispense Refill  . amoxicillin-clavulanate (AUGMENTIN) 875-125 MG per tablet Take 1 tablet by mouth daily.  30 tablet  1  . Calcium Carb-Cholecalciferol (CALTRATE 600+D) 600-800 MG-UNIT TABS Take by mouth 2 (two) times daily.      .  Calcium-Magnesium-Vitamin D 400-166.7-133.3 MG-MG-UNIT TABS 1 tablet. Take 1 tablet by mouth daily.      Marland Kitchen everolimus (AFINITOR) 5 MG tablet 5 mg. Take 5 mg by mouth daily.      Marland Kitchen exemestane (AROMASIN) 25 MG tablet Take 25 mg by mouth daily after breakfast.      . irbesartan (AVAPRO) 300 MG tablet Take 300 mg by mouth at bedtime.      . lactobacillus acidophilus (BACID) TABS Take 1 tablet by mouth 1 day or 1 dose.       Marland Kitchen LORazepam (ATIVAN) 1 MG tablet Take 1 mg by mouth at bedtime.       Marland Kitchen morphine (KADIAN) 80 MG 24 hr capsule Take 80 mg by mouth 2 (two) times daily.       Marland Kitchen morphine (MSIR) 15 MG tablet Take 15 mg by mouth 1 day or 1 dose. Takes once daily      . multivitamin (THERAGRAN) per tablet Take 1 tablet by mouth once a week.      . ondansetron (ZOFRAN) 4 MG tablet Take 1 tablet (4 mg total) by mouth every 8 (eight) hours as needed.  20 tablet  1  . pregabalin (LYRICA) 100 MG capsule Take 100 mg by mouth 2 (two) times daily. Takes 150 mgms      . Zoledronic Acid (ZOMETA IV) Inject into the vein every 30 (thirty) days.       No current facility-administered medications for this visit.       Review of Systems:     Cardiac Review of Systems: Y or N  Chest Pain [  n  ]  Resting SOB [  n ] Exertional SOB  [  y]  Orthopnea Milo.Brash  ]   Pedal Edema [   ]    Palpitations n  ] Syncope  [n ]   Presyncope [  n ]  General Review of Systems: [Y] = yes [  ]=no Constitional: recent weight change Cove.Etienne  ]; anorexia [  ]; fatigue [ y ]; nausea [ y ]; night sweats [  ]; fever [n  ]; or chills [ n ];  Dental: poor dentition[  ];   Eye : blurred vision [  ]; diplopia [   ]; vision changes [  ];  Amaurosis fugax[  ]; Resp: cough [  ];  wheezing[  ];  hemoptysis[  ]; shortness of breath[  ]; paroxysmal nocturnal dyspnea[  ]; dyspnea on exertion[  ]; or orthopnea[  ];  GI:   gallstones[  ], vomiting[  ];  dysphagia[  ]; melena[  ];  hematochezia [  ]; heartburn[  ];   Hx of  Colonoscopy[  ]; GU: kidney stones [  ]; hematuria[  ];   dysuria [  ];  nocturia[  ];  history of     obstruction [  ];             Skin: rash, swelling[  ];, hair loss[  ];  peripheral edema[  ];  or itching[  ]; Musculosketetal: myalgias[  ];  joint swelling[  ];  joint erythema[  ];  joint pain[  ];  back pain[  ];  Heme/Lymph: bruising[  ];  bleeding[  ];  anemia[  ];  Neuro: TIA[  ];  headaches[  ];  stroke[  ];  vertigo[  ];  seizures[  ];   paresthesias[  ];  difficulty walking[  ];  Psych:depression[  ]; anxiety[  ];  Endocrine: diabetes[  ];  thyroid dysfunction[  ];  Immunizations: Flu [?  ]; Pneumococcal[?  ];  Other:  Physical Exam: BP 115/75  Pulse 92  Resp 16  Ht 5\' 2"  (1.575 m)  Wt 126 lb (57.153 kg)  BMI 23.04 kg/m2  SpO2 98%  General appearance: alert, cooperative and appears stated age Neurologic: intact Heart: regular rate and rhythm, S1, S2 normal, no murmur, click, rub or gallop and normal apical impulse Lungs: clear to auscultation bilaterally and normal percussion bilaterally Abdomen: soft, non-tender; bowel sounds normal; no masses,  no organomegaly Extremities: extremities normal, atraumatic, no cyanosis or edema and Homans sign is negative, no sign of DVT Wound: Patient's sternal wound has a less then 1cm area go granulation tissue clean in appearence, much better then last week.    Diagnostic Studies & Laboratory data:     Recent Radiology Findings:   recent MRI of liver at [facility]- shows progression of metastatic disease  Recent Lab Findings: Lab Results  Component Value Date   WBC 6.4 04/05/2007   HGB 11.1* 04/05/2007   HCT 32.4* 04/05/2007   PLT 204 04/05/2007   GLUCOSE 107* 04/05/2007   ALT 21 04/01/2007   AST 31 04/01/2007   NA 140 04/05/2007   K 3.5 04/05/2007   CL 111 04/05/2007   CREATININE 0.53 04/05/2007   BUN 1* 04/05/2007   CO2 24  04/05/2007   INR 1.4 04/03/2007   PATH:  Soft tissue mass, simple excision, Chest wall wound over mid sternum INFLAMED GRANULATION TISSUE WITH FIBRINOPURULENT EXUDATE. NO EVIDENCE OF MALIGNANCY.  Assessment / Plan:     Patient with known stage IV breast cancer who's been on antibiotic suppression therapy for question of indolent infection of prosthetic material placed in her chest or chest wall resection and reconstruction years ago.  Will continue with local wound care, so far no positive histology or culture of area, but concern of necrosis in area of previous radiation and chest wall reconstruction.  Will see back in one week.   Delight Ovens MD  Beeper [phone] Office [phone] 06/27/2012 4:19 PM

## 2012-07-04 ENCOUNTER — Ambulatory Visit (INDEPENDENT_AMBULATORY_CARE_PROVIDER_SITE_OTHER): Payer: Medicare Other | Admitting: Cardiothoracic Surgery

## 2012-07-04 ENCOUNTER — Encounter: Payer: Self-pay | Admitting: Cardiothoracic Surgery

## 2012-07-04 VITALS — BP 114/72 | HR 86 | Resp 16 | Ht 62.0 in | Wt 126.0 lb

## 2012-07-04 DIAGNOSIS — Z09 Encounter for follow-up examination after completed treatment for conditions other than malignant neoplasm: Secondary | ICD-10-CM

## 2012-07-04 DIAGNOSIS — Z792 Long term (current) use of antibiotics: Secondary | ICD-10-CM

## 2012-07-04 DIAGNOSIS — Z5189 Encounter for other specified aftercare: Secondary | ICD-10-CM

## 2012-07-04 NOTE — Progress Notes (Signed)
[address]       [place] [address]             [phone]                                 Mariah Grimes [facility] Medical Record #[id number] Date of Birth: Apr 05, 1945  Referring: Sissy Hoff, MD Primary Care: Sissy Hoff, MD  Chief Complaint:   Question of infection of chest wall resection   History of Present Illness:    Small area of clear drainage with decreased ery thymia noted on follow up today  Current Activity/ Functional Status: Patient isindependent with mobility/ambulation, transfers, ADL's, IADL's.   Past Medical History  Diagnosis Date  . Asthma   . Breast cancer     Breast Right  . Hypertension     Past Surgical History  Procedure Laterality Date  . Combined hysterectomy abdominal w/ a&p repair / oophorectomy  2005  . Resection sternum and ribs  2003  . Biopsy of right breast lymph nodes  1997    Family History  Problem Relation Age of Onset  . Cancer Mother     Breast  . Cancer Father     prostate  . Cancer Maternal Aunt     breast  . Cancer Maternal Grandmother     breast  . Cancer Paternal Grandfather     lung    History   Social History  . Marital Status: Married    Spouse Name: N/A    Number of Children: N/A  . Years of Education: N/A   Occupational History  . Not on file.   Social History Main Topics  . Smoking status: Never Smoker   . Smokeless tobacco: Never Used  . Alcohol Use: No  . Drug Use: No  . Sexually Active: Not on file   Other Topics Concern  . Not on file   Social History Narrative  . No narrative on file    History  Smoking status  . Never Smoker   Smokeless tobacco  . Never Used    History  Alcohol Use No     Allergies  Allergen Reactions  . Keflex (Cephalexin) Other (See Comments)    Pt does not remember   . Methadone   . Oxycodone     Hot flashes    Current Outpatient Prescriptions  Medication Sig Dispense Refill  . amoxicillin-clavulanate  (AUGMENTIN) 875-125 MG per tablet Take 1 tablet by mouth daily.  30 tablet  1  . Calcium Carb-Cholecalciferol (CALTRATE 600+D) 600-800 MG-UNIT TABS Take by mouth 2 (two) times daily.      Marland Kitchen everolimus (AFINITOR) 5 MG tablet 5 mg. Take 5 mg by mouth daily.      Marland Kitchen exemestane (AROMASIN) 25 MG tablet Take 25 mg by mouth daily after breakfast.      . irbesartan (AVAPRO) 300 MG tablet Take 300 mg by mouth at bedtime.      . lactobacillus acidophilus (BACID) TABS Take 1 tablet by mouth 1 day or 1 dose.       Marland Kitchen LORazepam (ATIVAN) 1 MG tablet Take 1 mg by mouth at bedtime.       Marland Kitchen morphine (KADIAN) 100 MG 24 hr capsule Take 100 mg by mouth 2 (two) times daily.      Marland Kitchen morphine (MSIR) 15 MG tablet Take 15 mg by mouth 1  day or 1 dose. Takes once daily      . multivitamin (THERAGRAN) per tablet Take 1 tablet by mouth once a week.      . ondansetron (ZOFRAN) 4 MG tablet Take 1 tablet (4 mg total) by mouth every 8 (eight) hours as needed.  20 tablet  1  . pregabalin (LYRICA) 150 MG capsule Take 150 mg by mouth 2 (two) times daily.      . Zoledronic Acid (ZOMETA IV) Inject into the vein every 30 (thirty) days.       No current facility-administered medications for this visit.       Physical Exam: BP 114/72  Pulse 86  Resp 16  Ht 5\' 2"  (1.575 m)  Wt 126 lb (57.153 kg)  BMI 23.04 kg/m2  SpO2 98%  General appearance: alert, cooperative and appears stated age Neurologic: intact Heart: regular rate and rhythm, S1, S2 normal, no murmur, click, rub or gallop and normal apical impulse Lungs: clear to auscultation bilaterally and normal percussion bilaterally Abdomen: soft, non-tender; bowel sounds normal; no masses,  no organomegaly Extremities: extremities normal, atraumatic, no cyanosis or edema and Homans sign is negative, no sign of DVT Wound: Patient's sternal wound has a less then 1cm area go granulation tissue clean in appearence, much better then last week.    Diagnostic Studies & Laboratory  data:     Recent Radiology Findings:   recent MRI of liver at [facility]- shows progression of metastatic disease  Recent Lab Findings: Lab Results  Component Value Date   WBC 6.4 04/05/2007   HGB 11.1* 04/05/2007   HCT 32.4* 04/05/2007   PLT 204 04/05/2007   GLUCOSE 107* 04/05/2007   ALT 21 04/01/2007   AST 31 04/01/2007   NA 140 04/05/2007   K 3.5 04/05/2007   CL 111 04/05/2007   CREATININE 0.53 04/05/2007   BUN 1* 04/05/2007   CO2 24 04/05/2007   INR 1.4 04/03/2007   PATH:  Soft tissue mass, simple excision, Chest wall wound over mid sternum INFLAMED GRANULATION TISSUE WITH FIBRINOPURULENT EXUDATE. NO EVIDENCE OF MALIGNANCY.  Assessment / Plan:     Patient with known stage IV breast cancer who's been on antibiotic suppression therapy for question of indolent infection of prosthetic material placed in her chest or chest wall resection and reconstruction years ago.  Will continue with local wound care, so far no positive histology or culture of area, but concern of necrosis in area of previous radiation and chest wall reconstruction.  Will see back in one week.   Delight Ovens MD  Beeper [phone] Office [phone] 07/04/2012 5:31 PM

## 2012-07-11 ENCOUNTER — Ambulatory Visit (INDEPENDENT_AMBULATORY_CARE_PROVIDER_SITE_OTHER): Payer: Medicare Other | Admitting: Cardiothoracic Surgery

## 2012-07-11 ENCOUNTER — Ambulatory Visit: Payer: Medicare Other | Admitting: Cardiothoracic Surgery

## 2012-07-11 ENCOUNTER — Encounter: Payer: Self-pay | Admitting: Cardiothoracic Surgery

## 2012-07-11 VITALS — BP 138/76 | HR 79 | Resp 20 | Ht 62.0 in | Wt 126.0 lb

## 2012-07-11 DIAGNOSIS — Z792 Long term (current) use of antibiotics: Secondary | ICD-10-CM

## 2012-07-11 DIAGNOSIS — T8132XS Disruption of internal operation (surgical) wound, not elsewhere classified, sequela: Secondary | ICD-10-CM

## 2012-07-11 DIAGNOSIS — C50919 Malignant neoplasm of unspecified site of unspecified female breast: Secondary | ICD-10-CM

## 2012-07-11 DIAGNOSIS — T889XXS Complication of surgical and medical care, unspecified, sequela: Secondary | ICD-10-CM

## 2012-07-11 NOTE — Progress Notes (Signed)
[address]       [place] [address]             [phone]                                 Mariah Grimes [facility] Medical Record #[id number] Date of Birth: January 04, 1946  Referring: Sissy Hoff, MD Primary Care: Sissy Hoff, MD  Chief Complaint:   Question of infection of chest wall resection   History of Present Illness:    Small area on sternal wound now almost completely healed, erythemia  Current Activity/ Functional Status: Patient isindependent with mobility/ambulation, transfers, ADL's, IADL's.   Past Medical History  Diagnosis Date  . Asthma   . Breast cancer     Breast Right  . Hypertension     Past Surgical History  Procedure Laterality Date  . Combined hysterectomy abdominal w/ a&p repair / oophorectomy  2005  . Resection sternum and ribs  2003  . Biopsy of right breast lymph nodes  1997    Family History  Problem Relation Age of Onset  . Cancer Mother     Breast  . Cancer Father     prostate  . Cancer Maternal Aunt     breast  . Cancer Maternal Grandmother     breast  . Cancer Paternal Grandfather     lung    History   Social History  . Marital Status: Married    Spouse Name: N/A    Number of Children: N/A  . Years of Education: N/A   Occupational History  . Not on file.   Social History Main Topics  . Smoking status: Never Smoker   . Smokeless tobacco: Never Used  . Alcohol Use: No  . Drug Use: No  . Sexually Active: Not on file   Other Topics Concern  . Not on file   Social History Narrative  . No narrative on file    History  Smoking status  . Never Smoker   Smokeless tobacco  . Never Used    History  Alcohol Use No     Allergies  Allergen Reactions  . Keflex (Cephalexin) Other (See Comments)    Pt does not remember   . Methadone   . Oxycodone     Hot flashes    Current Outpatient Prescriptions  Medication Sig Dispense Refill  . amoxicillin-clavulanate (AUGMENTIN)  875-125 MG per tablet Take 1 tablet by mouth daily.  30 tablet  1  . Calcium Carb-Cholecalciferol (CALTRATE 600+D) 600-800 MG-UNIT TABS Take by mouth 2 (two) times daily.      Marland Kitchen everolimus (AFINITOR) 5 MG tablet 5 mg. Take 5 mg by mouth daily.      Marland Kitchen exemestane (AROMASIN) 25 MG tablet Take 25 mg by mouth daily after breakfast.      . irbesartan (AVAPRO) 300 MG tablet Take 300 mg by mouth at bedtime.      . lactobacillus acidophilus (BACID) TABS Take 1 tablet by mouth 1 day or 1 dose.       Marland Kitchen LORazepam (ATIVAN) 1 MG tablet Take 1 mg by mouth at bedtime.       Marland Kitchen morphine (KADIAN) 100 MG 24 hr capsule Take 100 mg by mouth 2 (two) times daily.      Marland Kitchen morphine (MSIR) 15 MG tablet Take 15 mg by mouth 1 day or 1 dose.  Takes once daily      . multivitamin (THERAGRAN) per tablet Take 1 tablet by mouth once a week.      . ondansetron (ZOFRAN) 4 MG tablet Take 1 tablet (4 mg total) by mouth every 8 (eight) hours as needed.  20 tablet  1  . pregabalin (LYRICA) 150 MG capsule Take 150 mg by mouth 2 (two) times daily.      . Zoledronic Acid (ZOMETA IV) Inject into the vein every 30 (thirty) days.       No current facility-administered medications for this visit.       Physical Exam: BP 138/76  Pulse 79  Resp 20  Ht 5\' 2"  (1.575 m)  Wt 126 lb (57.153 kg)  BMI 23.04 kg/m2  SpO2 98%  General appearance: alert, cooperative and appears stated age Neurologic: intact Heart: regular rate and rhythm, S1, S2 normal, no murmur, click, rub or gallop and normal apical impulse Lungs: clear to auscultation bilaterally and normal percussion bilaterally Abdomen: soft, non-tender; bowel sounds normal; no masses,  no organomegaly Extremities: extremities normal, atraumatic, no cyanosis or edema and Homans sign is negative, no sign of DVT Wound: Patient's sternal wound now almost completely healed without erythemia    Diagnostic Studies & Laboratory data:     Recent Radiology Findings:   recent MRI [facility] liver  [facility]- shows progression of metastatic disease  Recent Lab Findings: Lab Results  Component Value Date   WBC 6.4 04/05/2007   HGB 11.1* 04/05/2007   HCT 32.4* 04/05/2007   PLT 204 04/05/2007   GLUCOSE 107* 04/05/2007   ALT 21 04/01/2007   AST 31 04/01/2007   NA 140 04/05/2007   K 3.5 04/05/2007   CL 111 04/05/2007   CREATININE 0.53 04/05/2007   BUN 1* 04/05/2007   CO2 24 04/05/2007   INR 1.4 04/03/2007   PATH:  Soft tissue mass, simple excision, Chest wall wound over mid sternum INFLAMED GRANULATION TISSUE WITH FIBRINOPURULENT EXUDATE. NO EVIDENCE OF MALIGNANCY.  Assessment / Plan:     Patient with known stage IV breast cancer who's been on antibiotic suppression therapy for question of indolent infection of prosthetic material placed in her chest or chest wall resection and reconstruction years ago.  Will continue with local wound care, so far no positive histology or culture of area, but concern of necrosis in area of previous radiation and chest wall reconstruction.  Will see back in 5  weeks.   Delight Ovens MD  Beeper [phone] Office [phone] 07/11/2012 1:09 PM

## 2012-08-08 ENCOUNTER — Ambulatory Visit: Payer: Medicare Other | Admitting: Cardiothoracic Surgery

## 2012-08-20 ENCOUNTER — Encounter: Payer: Self-pay | Admitting: Cardiothoracic Surgery

## 2012-08-20 ENCOUNTER — Ambulatory Visit (INDEPENDENT_AMBULATORY_CARE_PROVIDER_SITE_OTHER): Payer: Medicare Other | Admitting: Cardiothoracic Surgery

## 2012-08-20 VITALS — BP 123/76 | HR 79 | Resp 20 | Ht 62.0 in | Wt 125.0 lb

## 2012-08-20 DIAGNOSIS — C50919 Malignant neoplasm of unspecified site of unspecified female breast: Secondary | ICD-10-CM

## 2012-08-20 DIAGNOSIS — Z792 Long term (current) use of antibiotics: Secondary | ICD-10-CM

## 2012-08-20 DIAGNOSIS — T8132XS Disruption of internal operation (surgical) wound, not elsewhere classified, sequela: Secondary | ICD-10-CM

## 2012-08-20 DIAGNOSIS — T889XXS Complication of surgical and medical care, unspecified, sequela: Secondary | ICD-10-CM

## 2012-08-20 NOTE — Progress Notes (Signed)
[address]       [place] [address]             [phone]                                 BOBBE QUILTER [facility] Medical Record #[id number] Date of Birth: 1945-11-20  Referring: Sissy Hoff, MD Primary Care: Sissy Hoff, MD  Chief Complaint:   Question of infection of chest wall resection/chronic   History of Present Illness:    Small area on sternal wound now almost completely healed, erythemia  Current Activity/ Functional Status: Patient isindependent with mobility/ambulation, transfers, ADL's, IADL's.   Past Medical History  Diagnosis Date  . Asthma   . Breast cancer     Breast Right  . Hypertension     Past Surgical History  Procedure Laterality Date  . Combined hysterectomy abdominal w/ a&p repair / oophorectomy  2005  . Resection sternum and ribs  2003  . Biopsy of right breast lymph nodes  1997    Family History  Problem Relation Age of Onset  . Cancer Mother     Breast  . Cancer Father     prostate  . Cancer Maternal Aunt     breast  . Cancer Maternal Grandmother     breast  . Cancer Paternal Grandfather     lung    History   Social History  . Marital Status: Married    Spouse Name: N/A    Number of Children: N/A  . Years of Education: N/A   Occupational History  . Not on file.   Social History Main Topics  . Smoking status: Never Smoker   . Smokeless tobacco: Never Used  . Alcohol Use: No  . Drug Use: No  . Sexually Active: Not on file   Other Topics Concern  . Not on file   Social History Narrative  . No narrative on file    History  Smoking status  . Never Smoker   Smokeless tobacco  . Never Used    History  Alcohol Use No     Allergies  Allergen Reactions  . Keflex (Cephalexin) Other (See Comments)    Pt does not remember   . Methadone   . Oxycodone     Hot flashes    Current Outpatient Prescriptions  Medication Sig Dispense Refill  . amoxicillin-clavulanate  (AUGMENTIN) 875-125 MG per tablet Take 1 tablet by mouth daily.  30 tablet  1  . Calcium Carb-Cholecalciferol (CALTRATE 600+D) 600-800 MG-UNIT TABS Take by mouth 2 (two) times daily.      Marland Kitchen everolimus (AFINITOR) 5 MG tablet 5 mg. Take 5 mg by mouth daily.      Marland Kitchen exemestane (AROMASIN) 25 MG tablet Take 25 mg by mouth daily after breakfast.      . irbesartan (AVAPRO) 300 MG tablet Take 300 mg by mouth at bedtime.      . lactobacillus acidophilus (BACID) TABS Take 1 tablet by mouth 1 day or 1 dose.       Marland Kitchen LORazepam (ATIVAN) 1 MG tablet Take 1 mg by mouth at bedtime.       Marland Kitchen morphine (KADIAN) 100 MG 24 hr capsule Take 80 mg by mouth 2 (two) times daily.       Marland Kitchen morphine (MSIR) 15 MG tablet Take 15 mg by mouth 1 day or 1  dose. Takes once daily      . multivitamin (THERAGRAN) per tablet Take 1 tablet by mouth once a week.      . pregabalin (LYRICA) 150 MG capsule Take 150 mg by mouth 2 (two) times daily.      . Zoledronic Acid (ZOMETA IV) Inject into the vein every 30 (thirty) days.       No current facility-administered medications for this visit.       Physical Exam: BP 123/76  Pulse 79  Resp 20  Ht 5\' 2"  (1.575 m)  Wt 125 lb (56.7 kg)  BMI 22.86 kg/m2  SpO2 97%  General appearance: alert, cooperative and appears stated age Neurologic: intact Heart: regular rate and rhythm, S1, S2 normal, no murmur, click, rub or gallop and normal apical impulse Lungs: clear to auscultation bilaterally and normal percussion bilaterally Abdomen: soft, non-tender; bowel sounds normal; no masses,  no organomegaly Extremities: extremities normal, atraumatic, no cyanosis or edema and Homans sign is negative, no sign of DVT Wound: Patient's sternal wound now almost completely healed with very mild erythemia    Diagnostic Studies & Laboratory data:       Recent Lab Findings: Lab Results  Component Value Date   WBC 6.4 04/05/2007   HGB 11.1* 04/05/2007   HCT 32.4* 04/05/2007   PLT 204 04/05/2007    GLUCOSE 107* 04/05/2007   ALT 21 04/01/2007   AST 31 04/01/2007   NA 140 04/05/2007   K 3.5 04/05/2007   CL 111 04/05/2007   CREATININE 0.53 04/05/2007   BUN 1* 04/05/2007   CO2 24 04/05/2007   INR 1.4 04/03/2007   Assessment / Plan:     Patient with known stage IV breast cancer who's been on antibiotic suppression therapy for question of indolent infection of prosthetic material placed in her chest or chest wall resection and reconstruction years ago.  Will continue with local wound care, so far no positive histology or culture of area, but concern of necrosis in area of previous radiation and chest wall reconstruction.  Will see back in 8  weeks.   Delight Ovens MD  Beeper [phone] Office [phone] 08/20/2012 5:48 PM

## 2012-09-11 ENCOUNTER — Other Ambulatory Visit: Payer: Self-pay | Admitting: *Deleted

## 2012-09-11 DIAGNOSIS — Z792 Long term (current) use of antibiotics: Secondary | ICD-10-CM

## 2012-09-11 DIAGNOSIS — C50919 Malignant neoplasm of unspecified site of unspecified female breast: Secondary | ICD-10-CM

## 2012-09-11 DIAGNOSIS — L03319 Cellulitis of trunk, unspecified: Secondary | ICD-10-CM

## 2012-09-11 DIAGNOSIS — Z09 Encounter for follow-up examination after completed treatment for conditions other than malignant neoplasm: Secondary | ICD-10-CM

## 2012-09-11 MED ORDER — AMOXICILLIN-POT CLAVULANATE 875-125 MG PO TABS: 1.0000 | ORAL_TABLET | Freq: Every day | ORAL | Status: DC

## 2012-10-23 ENCOUNTER — Other Ambulatory Visit: Payer: Self-pay | Admitting: *Deleted

## 2012-10-23 DIAGNOSIS — L02219 Cutaneous abscess of trunk, unspecified: Secondary | ICD-10-CM

## 2012-10-24 ENCOUNTER — Encounter: Payer: Self-pay | Admitting: Cardiothoracic Surgery

## 2012-10-24 ENCOUNTER — Ambulatory Visit (INDEPENDENT_AMBULATORY_CARE_PROVIDER_SITE_OTHER): Payer: Medicare Other | Admitting: Cardiothoracic Surgery

## 2012-10-24 VITALS — BP 112/71 | HR 81 | Resp 20 | Ht 62.0 in | Wt 123.0 lb

## 2012-10-24 DIAGNOSIS — T889XXS Complication of surgical and medical care, unspecified, sequela: Secondary | ICD-10-CM

## 2012-10-24 DIAGNOSIS — C50919 Malignant neoplasm of unspecified site of unspecified female breast: Secondary | ICD-10-CM

## 2012-10-24 DIAGNOSIS — T8132XS Disruption of internal operation (surgical) wound, not elsewhere classified, sequela: Secondary | ICD-10-CM

## 2012-10-24 NOTE — Progress Notes (Signed)
[address]       [place] [address]             [phone]                            JOSELYNE SPAKE [facility] Medical Record #[id number] Date of Birth: 1945/12/21  Referring: Sissy Hoff, MD Primary Care: Sissy Hoff, MD  Chief Complaint:   Question of infection of chest wall resection/chronic   History of Present Illness:    Small area on sternal wound now almost completely healed, erythremia has resolved. Pateent feels well. Stared Gemzar yesterday for further rx of Stage IV breast ca  Current Activity/ Functional Status: Patient isindependent with mobility/ambulation, transfers, ADL's, IADL's.   Past Medical History  Diagnosis Date  . Asthma   . Breast cancer     Breast Right  . Hypertension     Past Surgical History  Procedure Laterality Date  . Combined hysterectomy abdominal w/ a&p repair / oophorectomy  2005  . Resection sternum and ribs  2003  . Biopsy of right breast lymph nodes  1997    Family History  Problem Relation Age of Onset  . Cancer Mother     Breast  . Cancer Father     prostate  . Cancer Maternal Aunt     breast  . Cancer Maternal Grandmother     breast  . Cancer Paternal Grandfather     lung    History   Social History  . Marital Status: Married    Spouse Name: N/A    Number of Children: N/A  . Years of Education: N/A   Occupational History  . Not on file.   Social History Main Topics  . Smoking status: Never Smoker   . Smokeless tobacco: Never Used  . Alcohol Use: No  . Drug Use: No  . Sexual Activity: Not on file   Other Topics Concern  . Not on file   Social History Narrative  . No narrative on file    History  Smoking status  . Never Smoker   Smokeless tobacco  . Never Used    History  Alcohol Use No     Allergies  Allergen Reactions  . Keflex [Cephalexin] Other (See Comments)    Pt does not remember   . Methadone   . Oxycodone     Hot flashes    Current  Outpatient Prescriptions  Medication Sig Dispense Refill  . amoxicillin-clavulanate (AUGMENTIN) 875-125 MG per tablet Take 1 tablet by mouth daily.  30 tablet  1  . Calcium Carb-Cholecalciferol (CALTRATE 600+D) 600-800 MG-UNIT TABS Take by mouth 2 (two) times daily.      Marland Kitchen gemcitabine (GEMZAR) 200 MG injection Inject into the vein once.      . irbesartan (AVAPRO) 300 MG tablet Take 300 mg by mouth at bedtime.      . lactobacillus acidophilus (BACID) TABS Take 1 tablet by mouth 1 day or 1 dose.       Marland Kitchen LORazepam (ATIVAN) 1 MG tablet Take 1 mg by mouth at bedtime.       Marland Kitchen morphine (KADIAN) 100 MG 24 hr capsule Take 80 mg by mouth 2 (two) times daily.       Marland Kitchen morphine (MSIR) 15 MG tablet Take 15 mg by mouth 1 day or 1 dose. Takes once daily      . multivitamin (  THERAGRAN) per tablet Take 1 tablet by mouth once a week.      . pregabalin (LYRICA) 150 MG capsule Take 150 mg by mouth 2 (two) times daily.      . Zoledronic Acid (ZOMETA IV) Inject into the vein every 30 (thirty) days.       No current facility-administered medications for this visit.       Physical Exam: BP 112/71  Pulse 81  Resp 20  Ht 5\' 2"  (1.575 m)  Wt 123 lb (55.792 kg)  BMI 22.49 kg/m2  SpO2 98%  General appearance: alert, cooperative and appears stated age Neurologic: intact Heart: regular rate and rhythm, S1, S2 normal, no murmur, click, rub or gallop and normal apical impulse Lungs: clear to auscultation bilaterally and normal percussion bilaterally Abdomen: soft, non-tender; bowel sounds normal; no masses,  no organomegaly Extremities: extremities normal, atraumatic, no cyanosis or edema and Homans sign is negative, no sign of DVT Wound: Patient's sternal wound now  completely healed with no erythremia or drainage. The best it has looked since I stared seeing her.    Diagnostic Studies & Laboratory data:       Recent Lab Findings: Lab Results  Component Value Date   WBC 6.4 04/05/2007   HGB 11.1*  04/05/2007   HCT 32.4* 04/05/2007   PLT 204 04/05/2007   GLUCOSE 107* 04/05/2007   ALT 21 04/01/2007   AST 31 04/01/2007   NA 140 04/05/2007   K 3.5 04/05/2007   CL 111 04/05/2007   CREATININE 0.53 04/05/2007   BUN 1* 04/05/2007   CO2 24 04/05/2007   INR 1.4 04/03/2007   Assessment / Plan:     Patient with known stage IV breast cancer who's been on antibiotic suppression therapy for question of indolent infection of prosthetic material placed in her chest or chest wall resection and reconstruction years ago.  Currently without obvious infection but will continue to monitor as it has flared up tin the past. Will see back prn or 3 months  Delight Ovens MD  Beeper [phone] Office [phone] 10/24/2012 6:39 PM

## 2013-01-23 ENCOUNTER — Encounter: Payer: Self-pay | Admitting: Cardiothoracic Surgery

## 2013-01-23 ENCOUNTER — Ambulatory Visit (INDEPENDENT_AMBULATORY_CARE_PROVIDER_SITE_OTHER): Payer: Medicare Other | Admitting: Cardiothoracic Surgery

## 2013-01-23 VITALS — Ht 62.0 in | Wt 112.0 lb

## 2013-01-23 DIAGNOSIS — Z792 Long term (current) use of antibiotics: Secondary | ICD-10-CM

## 2013-01-23 DIAGNOSIS — C50919 Malignant neoplasm of unspecified site of unspecified female breast: Secondary | ICD-10-CM

## 2013-01-23 DIAGNOSIS — C413 Malignant neoplasm of ribs, sternum and clavicle: Secondary | ICD-10-CM

## 2013-01-23 DIAGNOSIS — C7951 Secondary malignant neoplasm of bone: Secondary | ICD-10-CM

## 2013-01-23 DIAGNOSIS — L03319 Cellulitis of trunk, unspecified: Secondary | ICD-10-CM

## 2013-01-23 DIAGNOSIS — L02219 Cutaneous abscess of trunk, unspecified: Secondary | ICD-10-CM

## 2013-01-23 DIAGNOSIS — Z09 Encounter for follow-up examination after completed treatment for conditions other than malignant neoplasm: Secondary | ICD-10-CM

## 2013-01-23 MED ORDER — AMOXICILLIN-POT CLAVULANATE 875-125 MG PO TABS: 1.0000 | ORAL_TABLET | Freq: Every day | ORAL | Status: DC

## 2013-01-23 NOTE — Progress Notes (Signed)
[address]       [place] [address]             [phone]                            MIIA BLANKS [facility] Medical Record #[id number] Date of Birth: September 12, 1945  Referring: Sissy Hoff, MD Primary Care: Sissy Hoff, MD  Chief Complaint:   Question of infection of chest wall resection/chronic   History of Present Illness:    Small area on sternal wound now almost completely healed, erythremia has resolved. Patient denies any bone pain or any drainage.  Current Activity/ Functional Status: Patient isindependent with mobility/ambulation, transfers, ADL's, IADL's.   Past Medical History  Diagnosis Date  . Asthma   . Breast cancer     Breast Right  . Hypertension     Past Surgical History  Procedure Laterality Date  . Combined hysterectomy abdominal w/ a&p repair / oophorectomy  2005  . Resection sternum and ribs  2003  . Biopsy of right breast lymph nodes  1997    Family History  Problem Relation Age of Onset  . Cancer Mother     Breast  . Cancer Father     prostate  . Cancer Maternal Aunt     breast  . Cancer Maternal Grandmother     breast  . Cancer Paternal Grandfather     lung    History   Social History  . Marital Status: Married    Spouse Name: N/A    Number of Children: N/A  . Years of Education: N/A   Occupational History  . Not on file.   Social History Main Topics  . Smoking status: Never Smoker   . Smokeless tobacco: Never Used  . Alcohol Use: No  . Drug Use: No  . Sexual Activity: Not on file   Other Topics Concern  . Not on file   Social History Narrative  . No narrative on file    History  Smoking status  . Never Smoker   Smokeless tobacco  . Never Used    History  Alcohol Use No     Allergies  Allergen Reactions  . Keflex [Cephalexin] Other (See Comments)    Pt does not remember   . Methadone   . Oxycodone     Hot flashes    Current Outpatient Prescriptions  Medication  Sig Dispense Refill  . amoxicillin-clavulanate (AUGMENTIN) 875-125 MG per tablet Take 1 tablet by mouth daily.  30 tablet  1  . Calcium Carb-Cholecalciferol (CALTRATE 600+D) 600-800 MG-UNIT TABS Take by mouth 2 (two) times daily.      Marland Kitchen gemcitabine (GEMZAR) 200 MG injection Inject into the vein once.      . irbesartan (AVAPRO) 300 MG tablet Take 300 mg by mouth at bedtime.      . lactobacillus acidophilus (BACID) TABS Take 1 tablet by mouth 1 day or 1 dose.       Marland Kitchen LORazepam (ATIVAN) 1 MG tablet Take 1 mg by mouth at bedtime.       Marland Kitchen morphine (KADIAN) 100 MG 24 hr capsule Take 80 mg by mouth 2 (two) times daily.       Marland Kitchen morphine (MSIR) 15 MG tablet Take 15 mg by mouth 1 day or 1 dose. Takes once daily      . multivitamin (THERAGRAN) per tablet Take 1 tablet  by mouth once a week.      . pregabalin (LYRICA) 150 MG capsule Take 150 mg by mouth 2 (two) times daily.      . Zoledronic Acid (ZOMETA IV) Inject into the vein every 30 (thirty) days.       No current facility-administered medications for this visit.       Physical Exam: There were no vitals taken for this visit.  General appearance: alert, cooperative and appears stated age Neurologic: intact Heart: regular rate and rhythm, S1, S2 normal, no murmur, click, rub or gallop and normal apical impulse Lungs: clear to auscultation bilaterally and normal percussion bilaterally Abdomen: soft, non-tender; bowel sounds normal; no masses,  no organomegaly Extremities: extremities normal, atraumatic, no cyanosis or edema and Homans sign is negative, no sign of DVT Wound: Patient's sternal wound now  completely healed with no erythremia or drainage. The best it has looked since I stared seeing her.    Diagnostic Studies & Laboratory data:       Recent Lab Findings: Lab Results  Component Value Date   WBC 6.4 04/05/2007   HGB 11.1* 04/05/2007   HCT 32.4* 04/05/2007   PLT 204 04/05/2007   GLUCOSE 107* 04/05/2007   ALT 21 04/01/2007    AST 31 04/01/2007   NA 140 04/05/2007   K 3.5 04/05/2007   CL 111 04/05/2007   CREATININE 0.53 04/05/2007   BUN 1* 04/05/2007   CO2 24 04/05/2007   INR 1.4 04/03/2007   Assessment / Plan:     Patient with known stage IV breast cancer who's been on antibiotic suppression therapy for question of indolent infection of prosthetic material placed in her chest or chest wall resection and reconstruction years ago.  Currently without obvious infection but will continue to monitor as it has flared up tin the past. Will see back  3 months  Delight Ovens MD  Beeper [phone] Office [phone] 01/23/2013 10:24 AM

## 2013-04-24 ENCOUNTER — Ambulatory Visit (INDEPENDENT_AMBULATORY_CARE_PROVIDER_SITE_OTHER): Payer: Medicare Other | Admitting: Cardiothoracic Surgery

## 2013-04-24 ENCOUNTER — Encounter: Payer: Self-pay | Admitting: Cardiothoracic Surgery

## 2013-04-24 VITALS — BP 111/73 | HR 86 | Resp 16 | Ht 62.0 in | Wt 120.0 lb

## 2013-04-24 DIAGNOSIS — Z5189 Encounter for other specified aftercare: Secondary | ICD-10-CM

## 2013-04-24 DIAGNOSIS — L02219 Cutaneous abscess of trunk, unspecified: Secondary | ICD-10-CM

## 2013-04-24 DIAGNOSIS — C413 Malignant neoplasm of ribs, sternum and clavicle: Secondary | ICD-10-CM

## 2013-04-24 DIAGNOSIS — Z792 Long term (current) use of antibiotics: Secondary | ICD-10-CM

## 2013-04-24 DIAGNOSIS — L03319 Cellulitis of trunk, unspecified: Secondary | ICD-10-CM

## 2013-04-24 DIAGNOSIS — Z09 Encounter for follow-up examination after completed treatment for conditions other than malignant neoplasm: Secondary | ICD-10-CM

## 2013-04-24 MED ORDER — AMOXICILLIN-POT CLAVULANATE 875-125 MG PO TABS: 1.0000 | ORAL_TABLET | Freq: Every day | ORAL | Status: DC

## 2013-04-24 NOTE — Progress Notes (Signed)
[address]       [place] [address]             [phone]                              Mirinda H Rayner Vaughn Medical Record #[id number] Date of Birth: Jun 07, 1945  Referring: Gara Kroner, MD Primary Care: Gara Kroner, MD  Chief Complaint:   Question of infection of chest wall resection/chronic   History of Present Illness:    Patient continues to do well without any recurrent evidence of infection in the chest wall incision. She did have a upper respiratory infection several weeks ago but this has cleared. She continues on suppressive antibiotic therapy.  Current Activity/ Functional Status: Patient isindependent with mobility/ambulation, transfers, ADL's, IADL's. Zubrod Score: At the time of surgery this patient's most appropriate activity status/level should be described as: []     0    Normal activity, no symptoms [x]     1    Restricted in physical strenuous activity but ambulatory, able to do out light work []     2    Ambulatory and capable of self care, unable to do work activities, up and about >50 % of waking hours                              []     3    Only limited self care, in bed greater than 50% of waking hours []     4    Completely disabled, no self care, confined to bed or chair []     5    Moribund   Past Medical History  Diagnosis Date  . Asthma   . Breast cancer     Breast Right  . Hypertension     Past Surgical History  Procedure Laterality Date  . Combined hysterectomy abdominal w/ a&p repair / oophorectomy  2005  . Resection sternum and ribs  2003  . Biopsy of right breast lymph nodes  1997    Family History  Problem Relation Age of Onset  . Cancer Mother     Breast  . Cancer Father     prostate  . Cancer Maternal Aunt     breast  . Cancer Maternal Grandmother     breast  . Cancer Paternal Grandfather     lung    History   Social History  . Marital Status: Married    Spouse Name: N/A   Number of Children: N/A  . Years of Education: N/A   Occupational History  . Not on file.   Social History Main Topics  . Smoking status: Never Smoker   . Smokeless tobacco: Never Used  . Alcohol Use: No  . Drug Use: No  . Sexual Activity: Not on file   Other Topics Concern  . Not on file   Social History Narrative  . No narrative on file    History  Smoking status  . Never Smoker   Smokeless tobacco  . Never Used    History  Alcohol Use No     Allergies  Allergen Reactions  . Keflex [Cephalexin] Other (See Comments)    Pt does not remember   . Methadone   . Oxycodone     Hot flashes    Current Outpatient Prescriptions  Medication Sig Dispense Refill  . albuterol (PROVENTIL HFA;VENTOLIN HFA) 108 (90 BASE) MCG/ACT inhaler Inhale into the lungs every 6 (six) hours as needed for wheezing or shortness of breath.      Marland Kitchen amoxicillin-clavulanate (AUGMENTIN) 875-125 MG per tablet Take 1 tablet by mouth daily.  30 tablet  3  . Calcium Carb-Cholecalciferol (CALTRATE 600+D) 600-800 MG-UNIT TABS Take by mouth. 2 WEEKLY      . guaiFENesin (MUCINEX) 600 MG 12 hr tablet Take by mouth 2 (two) times daily as needed.      . irbesartan (AVAPRO) 300 MG tablet Take 300 mg by mouth at bedtime.      . lactobacillus acidophilus (BACID) TABS Take 1 tablet by mouth 1 day or 1 dose.       Marland Kitchen LORazepam (ATIVAN) 1 MG tablet Take 1 mg by mouth at bedtime.       Marland Kitchen morphine (KADIAN) 80 MG 24 hr capsule Take 80 mg by mouth 2 (two) times daily.      Marland Kitchen PACLitaxel-protein bound (ABRAXANE) 100 MG injection Inject 152 mg/m2 into the vein once a week. 3 WEEKS ON...1 WEEK OFF      . phenylephrine (SUDAFED PE) 10 MG TABS tablet Take 20 mg by mouth every 6 (six) hours as needed.      . pregabalin (LYRICA) 100 MG capsule Take 100 mg by mouth 2 (two) times daily.      . Zoledronic Acid (ZOMETA IV) Inject into the vein every 30 (thirty) days.       No current facility-administered medications for this  visit.       Physical Exam: BP 111/73  Pulse 86  Resp 16  Ht 5\' 2"  (1.575 m)  Wt 120 lb (54.432 kg)  BMI 21.94 kg/m2  SpO2 97%  General appearance: alert, cooperative and appears stated age Neurologic: intact Heart: regular rate and rhythm, S1, S2 normal, no murmur, click, rub or gallop and normal apical impulse Lungs: clear to auscultation bilaterally and normal percussion bilaterally Abdomen: soft, non-tender; bowel sounds normal; no masses,  no organomegaly Extremities: extremities normal, atraumatic, no cyanosis or edema and Homans sign is negative, no sign of DVT Wound: Patient's wound remains well healed I do not appreciate any cervical or supraclavicular or axillary adenopathy Patient's sternal wound now  completely healed with no erythremia or drainage. The best it has looked since I stared seeing her.       Today                                                                              One year ago   Diagnostic Studies & Laboratory data:       Recent Lab Findings: Lab Results  Component Value Date   WBC 6.4 04/05/2007   HGB 11.1* 04/05/2007   HCT 32.4* 04/05/2007   PLT 204 04/05/2007   GLUCOSE 107* 04/05/2007   ALT 21 04/01/2007   AST 31 04/01/2007   NA 140 04/05/2007   K 3.5 04/05/2007   CL 111 04/05/2007   CREATININE 0.53 04/05/2007   BUN 1* 04/05/2007   CO2 24 04/05/2007   INR 1.4 04/03/2007   Assessment / Plan:    Patient  with known stage IV breast cancer who's been on antibiotic suppression therapy for question of indolent infection of prosthetic material placed in her chest or chest wall resection and reconstruction years ago.  Currently without obvious infection but will continue to monitor as it has flared up tin the past. Will see back  6 months  Grace Isaac MD  [place] Office [phone] 04/24/2013 12:15 PM

## 2013-10-30 ENCOUNTER — Ambulatory Visit: Payer: Medicare Other | Admitting: Cardiothoracic Surgery

## 2013-11-20 ENCOUNTER — Ambulatory Visit: Payer: Medicare Other | Admitting: Cardiothoracic Surgery

## 2013-12-18 ENCOUNTER — Ambulatory Visit: Payer: Medicare Other | Admitting: Cardiothoracic Surgery

## 2013-12-25 ENCOUNTER — Ambulatory Visit (INDEPENDENT_AMBULATORY_CARE_PROVIDER_SITE_OTHER): Payer: Medicare Other | Admitting: Cardiothoracic Surgery

## 2013-12-25 ENCOUNTER — Encounter: Payer: Self-pay | Admitting: Cardiothoracic Surgery

## 2013-12-25 VITALS — BP 138/80 | HR 86 | Resp 20 | Ht 62.0 in | Wt 125.0 lb

## 2013-12-25 DIAGNOSIS — C7951 Secondary malignant neoplasm of bone: Secondary | ICD-10-CM

## 2013-12-25 DIAGNOSIS — Z792 Long term (current) use of antibiotics: Secondary | ICD-10-CM

## 2013-12-25 DIAGNOSIS — C50919 Malignant neoplasm of unspecified site of unspecified female breast: Secondary | ICD-10-CM

## 2013-12-25 DIAGNOSIS — C413 Malignant neoplasm of ribs, sternum and clavicle: Secondary | ICD-10-CM

## 2013-12-25 DIAGNOSIS — L02213 Cutaneous abscess of chest wall: Secondary | ICD-10-CM

## 2013-12-25 DIAGNOSIS — L03313 Cellulitis of chest wall: Secondary | ICD-10-CM

## 2013-12-25 DIAGNOSIS — Z09 Encounter for follow-up examination after completed treatment for conditions other than malignant neoplasm: Secondary | ICD-10-CM

## 2013-12-25 MED ORDER — AMOXICILLIN-POT CLAVULANATE 875-125 MG PO TABS: 1.0000 | ORAL_TABLET | Freq: Every day | ORAL | Status: AC

## 2013-12-25 NOTE — Progress Notes (Signed)
[address]       [place] [address]             [phone]                              Mariah Grimes [place] Medical Record #[id number] Date of Birth: 15-May-1945  Referring: Gara Kroner, MD Primary Care: Gara Kroner, MD  Chief Complaint:   Question of infection of chest wall resection/chronic   History of Present Illness:    Patient continues to do well without any recurrent evidence of infection in the chest wall incision.  She continues on suppressive antibiotic therapy.  Current Activity/ Functional Status: Patient isindependent with mobility/ambulation, transfers, ADL's, IADL's. Zubrod Score: At the time of surgery this patient's most appropriate activity status/level should be described as: []     0    Normal activity, no symptoms [x]     1    Restricted in physical strenuous activity but ambulatory, able to do out light work []     2    Ambulatory and capable of self care, unable to do work activities, up and about >50 % of waking hours                              []     3    Only limited self care, in bed greater than 50% of waking hours []     4    Completely disabled, no self care, confined to bed or chair []     5    Moribund   Past Medical History  Diagnosis Date  . Asthma   . Breast cancer     Breast Right  . Hypertension     Past Surgical History  Procedure Laterality Date  . Combined hysterectomy abdominal w/ a&p repair / oophorectomy  2005  . Resection sternum and ribs  2003  . Biopsy of right breast lymph nodes  1997    Family History  Problem Relation Age of Onset  . Cancer Mother     Breast  . Cancer Father     prostate  . Cancer Maternal Aunt     breast  . Cancer Maternal Grandmother     breast  . Cancer Paternal Grandfather     lung    History   Social History  . Marital Status: Married    Spouse Name: N/A    Number of Children: N/A  . Years of Education: N/A   Occupational History  .  Not on file.   Social History Main Topics  . Smoking status: Never Smoker   . Smokeless tobacco: Never Used  . Alcohol Use: No  . Drug Use: No  . Sexual Activity: Not on file   Other Topics Concern  . Not on file   Social History Narrative    History  Smoking status  . Never Smoker   Smokeless tobacco  . Never Used    History  Alcohol Use No     Allergies  Allergen Reactions  . Keflex [Cephalexin] Other (See Comments)    Pt does not remember   . Methadone   . Oxycodone     Hot flashes    Current Outpatient Prescriptions  Medication Sig Dispense Refill  . amoxicillin-clavulanate (AUGMENTIN) 875-125 MG per tablet Take 1 tablet by mouth  daily. 30 tablet 3  . Calcium Carb-Cholecalciferol (CALTRATE 600+D) 600-800 MG-UNIT TABS Take by mouth. 2 WEEKLY    . irbesartan (AVAPRO) 300 MG tablet Take 300 mg by mouth at bedtime.    . lactobacillus acidophilus (BACID) TABS Take 1 tablet by mouth 1 day or 1 dose.     Marland Kitchen LORazepam (ATIVAN) 1 MG tablet Take 1 mg by mouth at bedtime.     Marland Kitchen morphine (KADIAN) 80 MG 24 hr capsule Take 100 mg by mouth 2 (two) times daily.     Marland Kitchen morphine (MSIR) 15 MG tablet Take 15 mg by mouth every 6 (six) hours as needed for severe pain.    Marland Kitchen PACLitaxel-protein bound (ABRAXANE) 100 MG injection Inject 152 mg/m2 into the vein once a week. 3 WEEKS ON...1 WEEK OFF    . pregabalin (LYRICA) 100 MG capsule Take 100 mg by mouth 2 (two) times daily.    . Zoledronic Acid (ZOMETA IV) Inject into the vein every 30 (thirty) days.     No current facility-administered medications for this visit.       Physical Exam: BP 138/80 mmHg  Pulse 86  Resp 20  Ht 5\' 2"  (1.575 m)  Wt 125 lb (56.7 kg)  BMI 22.86 kg/m2  SpO2 98%  General appearance: alert, cooperative and appears stated age Neurologic: intact Heart: regular rate and rhythm, S1, S2 normal, no murmur, click, rub or gallop and normal apical impulse Lungs: clear to auscultation bilaterally and normal  percussion bilaterally Abdomen: soft, non-tender; bowel sounds normal; no masses,  no organomegaly Extremities: extremities normal, atraumatic, no cyanosis or edema and Homans sign is negative, no sign of DVT Wound: Patient's wound remains well healed I do not appreciate any cervical or supraclavicular or axillary adenopathy Patient's sternal wound   completely healed with no erythremia or drainage. Tr.                                                                         Diagnostic Studies & Laboratory data:       Recent Lab Findings: Lab Results  Component Value Date   WBC 6.4 04/05/2007   HGB 11.1* 04/05/2007   HCT 32.4* 04/05/2007   PLT 204 04/05/2007   GLUCOSE 107* 04/05/2007   ALT 21 04/01/2007   AST 31 04/01/2007   NA 140 04/05/2007   K 3.5 04/05/2007   CL 111 04/05/2007   CREATININE 0.53 04/05/2007   BUN 1* 04/05/2007   CO2 24 04/05/2007   INR 1.4 04/03/2007   Assessment / Plan:    Patient with known stage IV breast cancer who's been on antibiotic suppression therapy for question of indolent infection of prosthetic material placed in her chest or chest wall resection and reconstruction years ago.  Currently without obvious infection but will continue to monitor as it has flared up tin the past. Will see back  As needed  Grace Isaac MD  Beeper [phone] Office [phone] 12/25/2013 5:15 PM

## 2014-01-28 ENCOUNTER — Other Ambulatory Visit: Payer: Self-pay | Admitting: Family Medicine

## 2014-01-28 ENCOUNTER — Ambulatory Visit
Admission: RE | Admit: 2014-01-28 | Discharge: 2014-01-28 | Disposition: A | Discharge status: Home / Self Care | Payer: Medicare Other | Source: Ambulatory Visit | Attending: Family Medicine | Admitting: Family Medicine

## 2014-01-28 DIAGNOSIS — R109 Unspecified abdominal pain: Secondary | ICD-10-CM

## 2014-07-30 ENCOUNTER — Encounter (HOSPITAL_COMMUNITY): Payer: Self-pay

## 2014-07-30 ENCOUNTER — Ambulatory Visit (HOSPITAL_COMMUNITY)
Admission: RE | Admit: 2014-07-30 | Discharge: 2014-07-30 | Disposition: A | Discharge status: Home / Self Care | Payer: Medicare Other | Source: Ambulatory Visit | Attending: Family Medicine | Admitting: Family Medicine

## 2014-07-30 ENCOUNTER — Other Ambulatory Visit (HOSPITAL_COMMUNITY): Payer: Self-pay | Admitting: Family Medicine

## 2014-07-30 DIAGNOSIS — Z8543 Personal history of malignant neoplasm of ovary: Secondary | ICD-10-CM | POA: Diagnosis not present

## 2014-07-30 DIAGNOSIS — R4182 Altered mental status, unspecified: Secondary | ICD-10-CM

## 2014-07-30 DIAGNOSIS — J320 Chronic maxillary sinusitis: Secondary | ICD-10-CM | POA: Diagnosis not present

## 2014-07-30 DIAGNOSIS — C50919 Malignant neoplasm of unspecified site of unspecified female breast: Secondary | ICD-10-CM | POA: Diagnosis not present

## 2014-07-30 DIAGNOSIS — G939 Disorder of brain, unspecified: Secondary | ICD-10-CM | POA: Diagnosis not present

## 2014-07-30 MED ORDER — IOHEXOL 300 MG/ML  SOLN
75.0000 mL | Freq: Once | INTRAMUSCULAR | Status: AC | PRN
  Administered 2014-07-30: 75 mL via INTRAVENOUS

## 2014-08-03 ENCOUNTER — Ambulatory Visit
Admission: RE | Admit: 2014-08-03 | Discharge: 2014-08-03 | Disposition: A | Discharge status: Home / Self Care | Payer: Medicare Other | Source: Ambulatory Visit | Attending: Radiation Oncology | Admitting: Radiation Oncology

## 2014-08-03 ENCOUNTER — Encounter: Payer: Self-pay | Admitting: Radiation Oncology

## 2014-08-03 VITALS — BP 132/67 | HR 78 | Temp 98.0°F | Resp 16 | Ht 62.0 in | Wt 120.8 lb

## 2014-08-03 DIAGNOSIS — Z510 Encounter for antineoplastic radiation therapy: Secondary | ICD-10-CM | POA: Diagnosis not present

## 2014-08-03 DIAGNOSIS — C7931 Secondary malignant neoplasm of brain: Secondary | ICD-10-CM

## 2014-08-03 DIAGNOSIS — I10 Essential (primary) hypertension: Secondary | ICD-10-CM | POA: Diagnosis not present

## 2014-08-03 DIAGNOSIS — Z79899 Other long term (current) drug therapy: Secondary | ICD-10-CM | POA: Insufficient documentation

## 2014-08-03 DIAGNOSIS — C50919 Malignant neoplasm of unspecified site of unspecified female breast: Secondary | ICD-10-CM | POA: Diagnosis present

## 2014-08-03 DIAGNOSIS — C7949 Secondary malignant neoplasm of other parts of nervous system: Secondary | ICD-10-CM

## 2014-08-03 HISTORY — DX: Malignant neoplasm of unspecified ovary: C56.9

## 2014-08-03 HISTORY — DX: Malignant neoplasm of bone and articular cartilage, unspecified: C41.9

## 2014-08-03 HISTORY — DX: Malignant neoplasm of brain, unspecified: C71.9

## 2014-08-03 NOTE — Progress Notes (Signed)
Please see the Nurse Progress Note in the MD Initial Consult Encounter for this patient. 

## 2014-08-03 NOTE — Progress Notes (Addendum)
Location/Histology of Brain Tumor:  Stage IV Breast.Ovarian/bone/hepatic metastses  now Brain metastases   Patient presented with symptoms of:  Mental status changes had CT head 07/30/14:  Past or anticipated interventions, if any, per neurosurgery:   Past or anticipated interventions, if any, per medical oncology: [facility] [address]  07/20/14 Dr. Gretta Began Desnoyers,MD , start treatment with [facility] and [facility],  ,f/u 08/17/14, Multiple chemotherapies over the years   Dose of Decadron, if applicable: 6 mg  Every 6 hours for 10 days,then taper  Recent neurologic symptoms, if any: CT head 07/30/14 FINDINGS: Multiple enhancing mass lesions compatible with metastatic disease. Many of these are partially calcified. The largest mass is in the left anterior basal ganglia measuring 4.9 x 4.1 cm with peripheral enhancement and central necrosis and central calcification. Mild surrounding edema.  7 mm enhancing lesion right frontal lobe.  8 mm calcification left posterior temporal lobe does not enhance but was not present previously and is suspicious for metastatic disease.  5 mm enhancing lesion right frontal lobe over the convexity.  11 mm enhancing mass in the left frontal convexity with small calcified nodule.  8 mm enhancing mass left parietal cortex over the convexity.  Ventricle size normal. Mild shift of the midline to the right related to the large left basal ganglia mass. There  Negative for acute hemorrhage. No acute infarct.  Chronic sinusitis with mucosal thickening and bony thickening of the right maxillary sinus. No metastatic skull lesions identified.  IMPRESSION: Findings consistent with extensive metastatic disease to the brain. Large enhancing mass in the left basal ganglia with mild midline shift. Multiple lesions are present, some which are calcified. No acute hemorrhage.   Seizures: NO  Headaches: No  Nausea: NO  Dizziness/ataxia: No  Difficulty with  hand coordination: right hand weakness  Focal numbness/weakness: weakness lower extremities, in w/c,   Visual deficits/changes: no  Confusion/Memory deficits: yes  Painful bone metastases at present, if any: Pain medicine appt 09/15/14 with Dr. Morey Hummingbird, E.Johnson,MD  SAFETY ISSUES:  Prior radiation? Yes,  Right Breast  1997, radiation right sacrum 09/26/04-10/20/04  Pacemaker/ICD? NO  Possible current pregnancy?  NO  Is the patient on methotrexate? 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, metastatic disease in sacrum,right sacrum  Sen on MRI and PET without bx, , Liver mets of right hepatic lobe ct scan 02/08/05,bx 10/11/12 ERPR+,HER2 neg, , chest wall +partial sternal resection and reconstruction 02/16/2003   Mother breast cancer, maternal aunt and maternal grandmother also breast cancer,paternal grandfather lung cancer  Allergies: Methadone=SOB,Cephalexin, Oxycontin(oxycodone)hot flashes BP 132/67 mmHg  Pulse 78  Temp(Src) 98 F (36.7 C) (Oral)  Resp 16  Ht $R'5\' 2"'Hh$  (1.575 m)  Wt 120 lb 12.8 oz (54.795 kg)  BMI 22.09 kg/m2  SpO2 98%  Wt Readings from Last 3 Encounters:  08/03/14 120 lb 12.8 oz (54.795 kg)  12/25/13 125 lb (56.7 kg)  04/24/13 120 lb (54.432 kg)

## 2014-08-04 ENCOUNTER — Encounter: Payer: Self-pay | Admitting: Radiation Oncology

## 2014-08-04 ENCOUNTER — Telehealth: Payer: Self-pay | Admitting: *Deleted

## 2014-08-04 DIAGNOSIS — C7931 Secondary malignant neoplasm of brain: Secondary | ICD-10-CM | POA: Insufficient documentation

## 2014-08-04 NOTE — Telephone Encounter (Signed)
Myra called,(daughter ) and asked that Dr. Lisbeth Renshaw call  Patient tomorrow  at her home phone [phone]  ,and not the daughters phone number, I will inbasket Dr. Lisbeth Renshaw of this rtequest, daughter thanked this Rn  2:45 PM

## 2014-08-04 NOTE — Progress Notes (Signed)
Radiation Oncology         [phone] ________________________________  Name: Mariah Grimes MRN: [id number]  Date: 08/03/2014  DOB: [birth year]  OE:VOJJKK,XFGHW W, MD  Desnoyers, Darletta Moll*     REFERRING PHYSICIAN: Desnoyers, Darletta Moll*   DIAGNOSIS: The primary encounter diagnosis was Secondary malignant neoplasm of brain and spinal cord. A diagnosis of Brain metastasis was also pertinent to this visit.   HISTORY OF PRESENT ILLNESS::Mariah Grimes is a 68 y.o. female who is seen for an initial consultation visit regarding the patient's diagnosis of metastatic breast cancer with a recent diagnosis of brain metastasis. The patient initially was diagnosed with early stage right-sided breast cancer in 1997. She subsequently was found to have metastasis in 2003. She originally underwent a lumpectomy followed by radiation treatment in 1997. She has received one additional course of radiation treatment which involved treatment to the right sacrum which was completed in 2006.  The patient has undergone multiple rounds of systemic treatment since her diagnosis of metastatic disease. Ultimately, she has failed multiple regimens. The patient most recently has been treated with Ibrance and Faslodex.  The patient has had slowly progressive disease which has involved parenchymal disease for quite some time. Liver metastases within the right hepatic lobe were seen as early as 2007. The patient has been receiving chemotherapy through [facility]. She did receive her original radiation treatment she states hearing [facility] and then subsequently received palliative radiation treatment at [facility].  The patient recently had some changes involving altered metal status. She has noticed some changes in word finding and the patient's family also indicates that they have noticed changes when discussing issues with her where she would not stay on topic. The patient was evaluated for some confusion beginning  around Father's Day. She was noted to have a low blood pressure with dehydration. After hydration there was some improvement but then the family felt that some confusion recur. This ultimately led to a CT scan of the head on 07/30/2014. Multiple intracranial lesions were seen with the largest measuring 4.9 cm within the left anterior lower basal ganglia. At least 5 additional enhancing lesions were seen. A mild shift left or right was noted which was associated with the largest tumor. Findings were felt to be consistent with metastatic disease to the brain. I have been asked to see the patient today for consideration of radiation treatment.    PREVIOUS RADIATION THERAPY: Yes yes as noted above, postoperative radiation treatment to the right breast and palliative radiation treatment to the sacrum.   PAST MEDICAL HISTORY:  has a past medical history of Asthma; Hypertension; Brain cancer (07/30/14 CT head); Ovarian cancer (2003); Bone cancer (2006); and Breast cancer (1997).     PAST SURGICAL HISTORY: Past Surgical History  Procedure Laterality Date  . Combined hysterectomy abdominal w/ a&p repair / oophorectomy  2005  . Resection sternum and ribs  2003  . Biopsy of right breast lymph nodes  1997  . Breast lumpectomy with axillary lymph node dissection Right 1997     FAMILY HISTORY: family history includes Cancer in her father, maternal aunt, maternal grandmother, mother, and paternal grandfather.   SOCIAL HISTORY:  reports that she has never smoked. She has never used smokeless tobacco. She reports that she does not drink alcohol or use illicit drugs.   ALLERGIES: Keflex; Methadone; and Oxycodone   MEDICATIONS:  Current Outpatient Prescriptions  Medication Sig Dispense Refill  . amoxicillin-clavulanate (AUGMENTIN) 875-125 MG per tablet Take 1  tablet by mouth daily. (Patient not taking: Reported on 08/03/2014) 30 tablet 3  . Calcium Carb-Cholecalciferol (CALTRATE 600+D) 600-800 MG-UNIT  TABS Take by mouth. 2 WEEKLY    . irbesartan (AVAPRO) 300 MG tablet Take 300 mg by mouth at bedtime.    . lactobacillus acidophilus (BACID) TABS Take 1 tablet by mouth 1 day or 1 dose.     Marland Kitchen LORazepam (ATIVAN) 1 MG tablet Take 1 mg by mouth at bedtime.     Marland Kitchen morphine (KADIAN) 80 MG 24 hr capsule Take 100 mg by mouth 2 (two) times daily.     Marland Kitchen morphine (MSIR) 15 MG tablet Take 15 mg by mouth every 6 (six) hours as needed for severe pain.    Marland Kitchen PACLitaxel-protein bound (ABRAXANE) 100 MG injection Inject 152 mg/m2 into the vein once a week. 3 WEEKS ON...1 WEEK OFF    . pregabalin (LYRICA) 100 MG capsule Take 100 mg by mouth 2 (two) times daily.    . Zoledronic Acid (ZOMETA IV) Inject into the vein every 30 (thirty) days.     No current facility-administered medications for this encounter.     REVIEW OF SYSTEMS:  A 15 point review of systems is documented in the electronic medical record. This was obtained by the nursing staff. However, I reviewed this with the patient to discuss relevant findings and make appropriate changes.  Pertinent items are noted in HPI.    PHYSICAL EXAM:  height is 5\' 2"  (1.575 m) and weight is 120 lb 12.8 oz (54.795 kg). Her oral temperature is 98 F (36.7 C). Her blood pressure is 132/67 and her pulse is 78. Her respiration is 16 and oxygen saturation is 98%.   ECOG = 2  0 - Asymptomatic (Fully active, able to carry on all predisease activities without restriction)  1 - Symptomatic but completely ambulatory (Restricted in physically strenuous activity but ambulatory and able to carry out work of a light or sedentary nature. For example, light housework, office work)  2 - Symptomatic, <50% in bed during the day (Ambulatory and capable of all self care but unable to carry out any work activities. Up and about more than 50% of waking hours)  3 - Symptomatic, >50% in bed, but not bedbound (Capable of only limited self-care, confined to bed or chair 50% or more of waking  hours)  4 - Bedbound (Completely disabled. Cannot carry on any self-care. Totally confined to bed or chair)  5 - Death   Eustace Pen MM, Creech RH, Tormey DC, et al. [phone]). "Toxicity and response criteria of the [facility]". [place] Oncol. 5 (6): 649-55  General: Well-developed, in no acute distress HEENT: Normocephalic, atraumatic; oral cavity clear Neck: Supple without any lymphadenopathy Cardiovascular: Regular rate and rhythm Respiratory: Clear to auscultation bilaterally GI: Soft, nontender, normal bowel sounds Extremities: No edema present Neuro: No focal deficits    LABORATORY DATA:  Lab Results  Component Value Date   WBC 6.4 04/05/2007   HGB 11.1* 04/05/2007   HCT 32.4* 04/05/2007   MCV 95.2 04/05/2007   PLT 204 04/05/2007   Lab Results  Component Value Date   NA 140 04/05/2007   K 3.5 04/05/2007   CL 111 04/05/2007   CO2 24 04/05/2007   Lab Results  Component Value Date   ALT 21 04/01/2007   AST 31 04/01/2007   ALKPHOS 81 04/01/2007   BILITOT 2.1* 04/01/2007      RADIOGRAPHY: Ct Head W Wo Contrast  07/30/2014   ADDENDUM REPORT: 07/30/2014 16:58  ADDENDUM: Critical Value/emergent results were called by telephone at the time of interpretation on 07/30/2014 at 4:58 pm to Dr. Dema Severin , who verbally acknowledged these results.  The patient has history of ovarian cancer as well as breast cancer. Given the calcified lesions, this is more likely ovarian cancer metastatic disease than breast cancer.   Electronically Signed   By: Franchot Gallo M.D.   On: 07/30/2014 16:58   07/30/2014   CLINICAL DATA:  Metastatic breast cancer.  Altered mental status.  EXAM: CT HEAD WITHOUT AND WITH CONTRAST  TECHNIQUE: Contiguous axial images were obtained from the base of the skull through the vertex without and with intravenous contrast  CONTRAST:  51mL OMNIPAQUE IOHEXOL 300 MG/ML  SOLN  COMPARISON:  CT head 09/14/2004  FINDINGS: Multiple enhancing mass lesions  compatible with metastatic disease. Many of these are partially calcified. The largest mass is in the left anterior basal ganglia measuring 4.9 x 4.1 cm with peripheral enhancement and central necrosis and central calcification. Mild surrounding edema.  7 mm enhancing lesion right frontal lobe.  8 mm calcification left posterior temporal lobe does not enhance but was not present previously and is suspicious for metastatic disease.  5 mm enhancing lesion right frontal lobe over the convexity.  11 mm enhancing mass in the left frontal convexity with small calcified nodule.  8 mm enhancing mass left parietal cortex over the convexity.  Ventricle size normal. Mild shift of the midline to the right related to the large left basal ganglia mass. There  Negative for acute hemorrhage.  No acute infarct.  Chronic sinusitis with mucosal thickening and bony thickening of the right maxillary sinus. No metastatic skull lesions identified.  IMPRESSION: Findings consistent with extensive metastatic disease to the brain. Large enhancing mass in the left basal ganglia with mild midline shift. Multiple lesions are present, some which are calcified. No acute hemorrhage.  Electronically Signed: By: Franchot Gallo M.D. On: 07/30/2014 16:44       IMPRESSION:  The patient has a history which has been long-standing of metastatic breast cancer. No history of brain metastasis until the patient's recent workup of some altered mental status changes. The patient was found to have one large 4.9 cm metastasis on CT scan of the head in addition to at least 5 other lesions which were quite a bit smaller. The patient has begun Decadron at 6 mg 4 times a day.  I discussed with the patient and her family the potential role for radiation treatment. We also discussed additional possible modalities for brain metastasis including surgery. From my discussion, the patient and her family are not certain how aggressive they wished to be. I believe they  are interested in radiation treatment but I'm not sure that she would want to proceed with resection even if there were feasible.  The patient will be discussed at the upcoming brain conference on 08/05/2014. In reviewing her imaging, I believe that the patient is a potential, although somewhat borderline candidate for radiosurgery. If she had no further lesions seen on MRI imaging, then I believe she could be treated in a single fraction to the smaller tumors and in a fractionated manner to the largest tumor. We will also obtain a recommendation from neurosurgery at conference on Wednesday regarding resectability of the largest tumor.    PLAN: I discussed all of this with the patient today. I will contact the patient on Wednesday after conference and we will  make a final decision regarding the best treatment have for her at that time.    I spent 55 minutes face to face with the patient and more than 50% of that time was spent in counseling and/or coordination of care.    ________________________________   Jodelle Gross, MD, PhD   **Disclaimer: This note was dictated with voice recognition software. Similar sounding words can inadvertently be transcribed and this note may contain transcription errors which may not have been corrected upon publication of note.**

## 2014-08-05 ENCOUNTER — Other Ambulatory Visit: Payer: Self-pay | Admitting: Radiation Therapy

## 2014-08-05 ENCOUNTER — Other Ambulatory Visit: Payer: Self-pay | Admitting: Radiation Oncology

## 2014-08-05 DIAGNOSIS — C7931 Secondary malignant neoplasm of brain: Secondary | ICD-10-CM

## 2014-08-06 ENCOUNTER — Telehealth: Payer: Self-pay | Admitting: *Deleted

## 2014-08-06 NOTE — Telephone Encounter (Signed)
Mariah Grimes in radiation called and I scheduled appts for patient. She will contact patient.

## 2014-08-07 ENCOUNTER — Ambulatory Visit: Payer: Medicare Other

## 2014-08-11 ENCOUNTER — Ambulatory Visit (HOSPITAL_BASED_OUTPATIENT_CLINIC_OR_DEPARTMENT_OTHER): Payer: Medicare Other

## 2014-08-11 ENCOUNTER — Ambulatory Visit
Admission: RE | Admit: 2014-08-11 | Discharge: 2014-08-11 | Disposition: A | Discharge status: Home / Self Care | Payer: Medicare Other | Source: Ambulatory Visit | Attending: Radiation Oncology | Admitting: Radiation Oncology

## 2014-08-11 ENCOUNTER — Other Ambulatory Visit: Payer: Medicare Other

## 2014-08-11 ENCOUNTER — Other Ambulatory Visit: Payer: Self-pay | Admitting: *Deleted

## 2014-08-11 VITALS — BP 91/54 | HR 85 | Temp 98.9°F

## 2014-08-11 DIAGNOSIS — C50919 Malignant neoplasm of unspecified site of unspecified female breast: Secondary | ICD-10-CM | POA: Diagnosis present

## 2014-08-11 DIAGNOSIS — C7931 Secondary malignant neoplasm of brain: Secondary | ICD-10-CM

## 2014-08-11 DIAGNOSIS — Z452 Encounter for adjustment and management of vascular access device: Secondary | ICD-10-CM

## 2014-08-11 DIAGNOSIS — Z95828 Presence of other vascular implants and grafts: Secondary | ICD-10-CM

## 2014-08-11 DIAGNOSIS — B370 Candidal stomatitis: Secondary | ICD-10-CM

## 2014-08-11 LAB — BUN AND CREATININE (CC13)
BUN: 34.4 mg/dL — ABNORMAL HIGH (ref 7.0–26.0)
CREATININE: 0.7 mg/dL (ref 0.6–1.1)
EGFR: 90 mL/min/{1.73_m2} — AB (ref >90)

## 2014-08-11 MED ORDER — MAGIC MOUTHWASH W/LIDOCAINE: 5.0000 mL | Freq: Four times a day (QID) | ORAL | Status: AC | PRN

## 2014-08-11 MED ORDER — GADOBENATE DIMEGLUMINE 529 MG/ML IV SOLN
11.0000 mL | Freq: Once | INTRAVENOUS | Status: AC | PRN
  Administered 2014-08-11: 11 mL via INTRAVENOUS

## 2014-08-11 MED ORDER — HEPARIN SOD (PORK) LOCK FLUSH 100 UNIT/ML IV SOLN
500.0000 [IU] | Freq: Once | INTRAVENOUS | Status: AC
  Administered 2014-08-11: 500 [IU] via INTRAVENOUS
  Filled 2014-08-11: qty 5

## 2014-08-11 MED ORDER — SODIUM CHLORIDE 0.9 % IJ SOLN
10.0000 mL | INTRAMUSCULAR | Status: DC | PRN
  Administered 2014-08-11: 10 mL via INTRAVENOUS
  Filled 2014-08-11: qty 10

## 2014-08-11 NOTE — Progress Notes (Signed)
Folllow up New Consult to discuss MRI results on 08/11/14,  Patient rx for MMW w/lidocaine for patient thrush per Selena Lesser, NP MRI 08/11/14: FINDINGS: There is no acute infarct or extra-axial fluid collection. Cerebral volume is within normal limits for age. Scattered, small foci of T2 hyperintensity in the cerebral white matter are nonspecific but compatible with mild chronic small vessel ischemic disease.  As seen on recent head CT, there is a dominant mass centered in the region of the left lentiform nucleus which measures 4.8 x 3.9 x 3.7 cm and demonstrates peripheral enhancement with central cystic change/ necrosis and a small amount of calcification. This results in partial effacement of the left lateral ventricle and 5 mm of rightward midline shift, unchanged. There is no significant surrounding edema.  Smaller enhancing brain lesions are as follows: 6 mm posterior right frontal lobe (series 10, image 105), 6 mm more medially in the posterior right frontal lobe (series 10, image 106), 8 mm anteroinferior right frontal lobe (series 10, image 79), 10 mm partially calcified lesion in the left temporal-occipital region (series 10, image 59), 5 mm superior left temporal lobe (series 10, image 73), 11 mm left parietal lobe (series 10, image 113), and 12 mm left frontal lobe (series 10, image 119). There is no significant edema or mass effect associated with any of these lesions.  Orbits are unremarkable. There is evidence of chronic right maxillary sinusitis. Mastoid ai Wt Readings from Last 3 Encounters:  08/11/14 122 lb (55.339 kg)  08/03/14 120 lb 12.8 oz (54.795 kg)  12/25/13 125 lb (56.7 kg)  r cells are clear. Major intracranial vascular flow voids are preserved. No destructive osseous lesions are identified.  IMPRESSION: Eight enhancing brain lesions consistent with metastases and unchanged from the recent CT. The largest is a 4.8 cm mass in the left basal ganglia  with slight rightward midline shift.  No pain at present, no headac she stated, no nausea, or blurred vision stated, appetite good, restarted Dexamethasone 4mg  oral 4x day,  On MMW for thrush , tongue is coated yellow thick, difficulty swallowing foods, gets strangled,   Even with fluids, chokes going down on left side throat  BP 116/66 mmHg  Pulse 69  Temp(Src) 98.6 F (37 C) (Oral)  Resp 16

## 2014-08-11 NOTE — Patient Instructions (Signed)

## 2014-08-11 NOTE — Progress Notes (Signed)
Pt in today for lab and flush.  Also scheduled for MRI later today.  Pt request to be left accessed with power port needle for MRI.  Pt and Pt family instructed to have needle removed after procedure (MRI) is over.  Pt and pt family member verbalize understanding to have pt deaccessed after MRI is completed.  Pt and family member complain that pt has developed thrush to the inside of her mouth.  Drue Second, NP called to assess pt's oral mucosa.  Per Drue Second, NP OK to order Lidocaine magic mouthwash.  Order for magic mouthwash with lidocaine 16ml QID PRN for mouth pain faxed to [facility] outpatient pharmacy @ 1350.  Pt instructed to swish and swallow the mouthwash and to follow up with Dr Lisbeth Renshaw at her appt tomorrow.  Pt and pt family verbalize understanding.

## 2014-08-12 ENCOUNTER — Ambulatory Visit
Admission: RE | Admit: 2014-08-12 | Discharge: 2014-08-12 | Disposition: A | Discharge status: Home / Self Care | Payer: Medicare Other | Source: Ambulatory Visit | Attending: Radiation Oncology | Admitting: Radiation Oncology

## 2014-08-12 ENCOUNTER — Encounter: Payer: Self-pay | Admitting: Radiation Oncology

## 2014-08-12 VITALS — BP 116/66 | HR 69 | Temp 98.6°F | Resp 16

## 2014-08-12 DIAGNOSIS — C50919 Malignant neoplasm of unspecified site of unspecified female breast: Secondary | ICD-10-CM | POA: Diagnosis not present

## 2014-08-12 DIAGNOSIS — C7949 Secondary malignant neoplasm of other parts of nervous system: Principal | ICD-10-CM

## 2014-08-12 DIAGNOSIS — C7931 Secondary malignant neoplasm of brain: Secondary | ICD-10-CM

## 2014-08-12 MED ORDER — DEXAMETHASONE 4 MG PO TABS: 4.0000 mg | ORAL_TABLET | Freq: Four times a day (QID) | ORAL | Status: AC

## 2014-08-12 NOTE — Progress Notes (Signed)
Please see the Nurse Progress Note in the MD Initial Consult Encounter for this patient. 

## 2014-08-13 NOTE — Progress Notes (Signed)
Radiation Oncology         [phone] ________________________________  Name: Mariah Grimes MRN: [id number]  Date: 08/12/2014  DOB: 05-31-1945  Follow-Up Visit Note  CC: Gara Kroner, MD  Desnoyers, Darletta Moll*  Diagnosis:   Metastatic rest cancer with brain metastasis  Narrative:  The patient returns today to discuss her recent MRI scan of the brain. The patient was found recently to have multiple brain metastases including a larger dominant tumor. Her case has been discussed in multidisciplinary brain conference and she also underwent a MRI scan of the brain for further evaluation. Initially, she was found to have 6 lesions. On this current scan, 2 additional lesions were seen.  The patient states that she clinically has been stable. No new complaints.                       ALLERGIES:  is allergic to keflex; methadone; and oxycodone.  Meds: Current Outpatient Prescriptions  Medication Sig Dispense Refill  . Alum & Mag Hydroxide-Simeth (MAGIC MOUTHWASH W/LIDOCAINE) SOLN Take 5 mLs by mouth 4 (four) times daily as needed for mouth pain. 240 mL 0  . Calcium Carb-Cholecalciferol (CALTRATE 600+D) 600-800 MG-UNIT TABS Take by mouth. 2 WEEKLY    . dexamethasone (DECADRON) 4 MG tablet Take 1 tablet (4 mg total) by mouth QID. 60 tablet 0  . irbesartan (AVAPRO) 300 MG tablet Take 300 mg by mouth at bedtime.    . lactobacillus acidophilus (BACID) TABS Take 1 tablet by mouth 1 day or 1 dose.     Marland Kitchen LORazepam (ATIVAN) 1 MG tablet Take 1 mg by mouth at bedtime.     Marland Kitchen morphine (KADIAN) 80 MG 24 hr capsule Take 100 mg by mouth 2 (two) times daily.     Marland Kitchen morphine (MSIR) 15 MG tablet Take 15 mg by mouth every 6 (six) hours as needed for severe pain.    Marland Kitchen oxymorphone (OPANA ER, CRUSH RESISTANT,) 10 MG T12A 12 hr tablet Take 10 mg by mouth. Once week prn    . pregabalin (LYRICA) 100 MG capsule Take 100 mg by mouth 2 (two) times daily.    Marland Kitchen amoxicillin-clavulanate (AUGMENTIN) 875-125 MG per tablet  Take 1 tablet by mouth daily. (Patient not taking: Reported on 08/03/2014) 30 tablet 3  . PACLitaxel-protein bound (ABRAXANE) 100 MG injection Inject 152 mg/m2 into the vein once a week. 3 WEEKS ON...1 WEEK OFF    . Zoledronic Acid (ZOMETA IV) Inject into the vein every 30 (thirty) days.     No current facility-administered medications for this encounter.    Physical Findings: The patient is in no acute distress. Patient is alert and oriented.  oral temperature is 98.6 F (37 C). Her blood pressure is 116/66 and her pulse is 69. Her respiration is 16. .     Lab Findings: Lab Results  Component Value Date   WBC 6.4 04/05/2007   HGB 11.1* 04/05/2007   HCT 32.4* 04/05/2007   MCV 95.2 04/05/2007   PLT 204 04/05/2007     Radiographic Findings: Ct Head W Wo Contrast  07/30/2014   ADDENDUM REPORT: 07/30/2014 16:58  ADDENDUM: Critical Value/emergent results were called by telephone at the time of interpretation on 07/30/2014 at 4:58 pm to Dr. Dema Severin , who verbally acknowledged these results.  The patient has history of ovarian cancer as well as breast cancer. Given the calcified lesions, this is more likely ovarian cancer metastatic disease than breast cancer.   Electronically  Signed   By: Franchot Gallo M.D.   On: 07/30/2014 16:58   07/30/2014   CLINICAL DATA:  Metastatic breast cancer.  Altered mental status.  EXAM: CT HEAD WITHOUT AND WITH CONTRAST  TECHNIQUE: Contiguous axial images were obtained from the base of the skull through the vertex without and with intravenous contrast  CONTRAST:  44mL OMNIPAQUE IOHEXOL 300 MG/ML  SOLN  COMPARISON:  CT head 09/14/2004  FINDINGS: Multiple enhancing mass lesions compatible with metastatic disease. Many of these are partially calcified. The largest mass is in the left anterior basal ganglia measuring 4.9 x 4.1 cm with peripheral enhancement and central necrosis and central calcification. Mild surrounding edema.  7 mm enhancing lesion right frontal lobe.  8  mm calcification left posterior temporal lobe does not enhance but was not present previously and is suspicious for metastatic disease.  5 mm enhancing lesion right frontal lobe over the convexity.  11 mm enhancing mass in the left frontal convexity with small calcified nodule.  8 mm enhancing mass left parietal cortex over the convexity.  Ventricle size normal. Mild shift of the midline to the right related to the large left basal ganglia mass. There  Negative for acute hemorrhage.  No acute infarct.  Chronic sinusitis with mucosal thickening and bony thickening of the right maxillary sinus. No metastatic skull lesions identified.  IMPRESSION: Findings consistent with extensive metastatic disease to the brain. Large enhancing mass in the left basal ganglia with mild midline shift. Multiple lesions are present, some which are calcified. No acute hemorrhage.  Electronically Signed: By: Franchot Gallo M.D. On: 07/30/2014 16:44   Mr Jeri Cos WE Contrast  08/11/2014   CLINICAL DATA:  History of right-sided breast cancer and ovarian cancer with evidence of brain metastases on recent CT. SRS targeting.  EXAM: MRI HEAD WITHOUT AND WITH CONTRAST  TECHNIQUE: Multiplanar, multiecho pulse sequences of the brain and surrounding structures were obtained without and with intravenous contrast.  CONTRAST:  4mL MULTIHANCE GADOBENATE DIMEGLUMINE 529 MG/ML IV SOLN  COMPARISON:  Head CT 07/30/2014  FINDINGS: There is no acute infarct or extra-axial fluid collection. Cerebral volume is within normal limits for age. Scattered, small foci of T2 hyperintensity in the cerebral white matter are nonspecific but compatible with mild chronic small vessel ischemic disease.  As seen on recent head CT, there is a dominant mass centered in the region of the left lentiform nucleus which measures 4.8 x 3.9 x 3.7 cm and demonstrates peripheral enhancement with central cystic change/ necrosis and a small amount of calcification. This results in  partial effacement of the left lateral ventricle and 5 mm of rightward midline shift, unchanged. There is no significant surrounding edema.  Smaller enhancing brain lesions are as follows: 6 mm posterior right frontal lobe (series 10, image 105), 6 mm more medially in the posterior right frontal lobe (series 10, image 106), 8 mm anteroinferior right frontal lobe (series 10, image 79), 10 mm partially calcified lesion in the left temporal-occipital region (series 10, image 59), 5 mm superior left temporal lobe (series 10, image 73), 11 mm left parietal lobe (series 10, image 113), and 12 mm left frontal lobe (series 10, image 119). There is no significant edema or mass effect associated with any of these lesions.  Orbits are unremarkable. There is evidence of chronic right maxillary sinusitis. Mastoid air cells are clear. Major intracranial vascular flow voids are preserved. No destructive osseous lesions are identified.  IMPRESSION: Eight enhancing brain lesions consistent with  metastases and unchanged from the recent CT. The largest is a 4.8 cm mass in the left basal ganglia with slight rightward midline shift.   Electronically Signed   By: Logan Bores   On: 08/11/2014 16:38    Impression:    The patient in my opinion remains a potential radiosurgery candidate. Based on her initial workup, we had previously discussed the possibility of whole brain radiation treatment. However, the patient would prefer radiosurgery if possible for multiple reasons including both convenience and possible efficacy. I do believe that this remains a possibility based on her current scan.  Plan:  The patient wishes to proceed with simulation as soon as possible such that we can begin treatment planning. I would anticipate treating the dominant lesion in a fractionated manner in 3 treatments. The other 7 lesions could be concurrently treated during the sessions. When she was discussed at brain conference, it was not felt that  surgery to the dominant lesion was warranted at this time. Based on follow-up imaging, and the fairly large size of the largest lesion, additional treatment to this area certainly may be necessary. The possibility of surgery or additional radiation treatment such as whole brain radiation treatment would be considered if appropriate based on her future imaging and her clinical status at that time. For now, we will proceed with radiosurgery planning.  The patient was seen today for 15 minutes, with the majority of the time spent counseling the patient on his diagnosis of cancer and coordinating his care.    Jodelle Gross, M.D., Ph.D.

## 2014-08-17 ENCOUNTER — Ambulatory Visit
Admission: RE | Admit: 2014-08-17 | Discharge: 2014-08-17 | Disposition: A | Discharge status: Home / Self Care | Payer: Medicare Other | Source: Ambulatory Visit | Attending: Radiation Oncology | Admitting: Radiation Oncology

## 2014-08-17 VITALS — BP 116/67 | HR 59 | Temp 98.7°F | Resp 12 | Wt 119.7 lb

## 2014-08-17 DIAGNOSIS — C50919 Malignant neoplasm of unspecified site of unspecified female breast: Secondary | ICD-10-CM | POA: Diagnosis not present

## 2014-08-17 DIAGNOSIS — C7931 Secondary malignant neoplasm of brain: Secondary | ICD-10-CM

## 2014-08-17 MED ORDER — SODIUM CHLORIDE 0.9 % IJ SOLN
10.0000 mL | INTRAMUSCULAR | Status: DC | PRN
  Administered 2014-08-17: 10 mL via INTRAVENOUS

## 2014-08-17 MED ORDER — HEPARIN SOD (PORK) LOCK FLUSH 100 UNIT/ML IV SOLN
500.0000 [IU] | Freq: Once | INTRAVENOUS | Status: AC
  Administered 2014-08-17: 500 [IU] via INTRAVENOUS

## 2014-08-17 NOTE — Progress Notes (Addendum)
Pt here for CT simulation. Medications and allergies checked.  Pt in not diabetic with no allergies to contrast dye. BUN and creatine levels check, Dr. Olegario Messier labs.  Pt identified by pt name and dob.  Pt positioned in supine, cleansed power port site and access with ease.  Pt tolerated procedure with no complaints.  Excellent blood return and huber needle secured with clear dressage. Notified Manuela Schwartz RT Navigator that pt is ready for simulation.  Pt transferred over to simulation with Manuela Schwartz at arm.

## 2014-08-20 DIAGNOSIS — C50919 Malignant neoplasm of unspecified site of unspecified female breast: Secondary | ICD-10-CM | POA: Diagnosis not present

## 2014-08-21 ENCOUNTER — Encounter: Payer: Self-pay | Admitting: Radiation Oncology

## 2014-08-21 ENCOUNTER — Ambulatory Visit
Admission: RE | Admit: 2014-08-21 | Discharge: 2014-08-21 | Disposition: A | Discharge status: Home / Self Care | Payer: Medicare Other | Source: Ambulatory Visit | Attending: Radiation Oncology | Admitting: Radiation Oncology

## 2014-08-21 ENCOUNTER — Ambulatory Visit: Payer: Medicare Other | Admitting: Radiation Oncology

## 2014-08-21 VITALS — BP 99/58 | HR 72 | Temp 98.3°F

## 2014-08-21 DIAGNOSIS — C7931 Secondary malignant neoplasm of brain: Secondary | ICD-10-CM

## 2014-08-21 DIAGNOSIS — C50919 Malignant neoplasm of unspecified site of unspecified female breast: Secondary | ICD-10-CM | POA: Diagnosis not present

## 2014-08-21 MED ORDER — FLUCONAZOLE 100 MG PO TABS: 100.0000 mg | ORAL_TABLET | Freq: Every day | ORAL | Status: AC

## 2014-08-21 NOTE — Progress Notes (Signed)
Patient here for 30 minute assessment post SRS brain.Patient has cauliflower looking white patches in back of throat and roof of mouth.Dr. Lisbeth Renshaw to send script to [facility].

## 2014-08-24 ENCOUNTER — Telehealth: Payer: Self-pay | Admitting: *Deleted

## 2014-08-24 ENCOUNTER — Ambulatory Visit
Admission: RE | Admit: 2014-08-24 | Discharge: 2014-08-24 | Disposition: A | Discharge status: Home / Self Care | Payer: Medicare Other | Source: Ambulatory Visit | Attending: Radiation Oncology | Admitting: Radiation Oncology

## 2014-08-24 VITALS — BP 114/63 | HR 67 | Temp 97.9°F | Resp 16

## 2014-08-24 DIAGNOSIS — C50919 Malignant neoplasm of unspecified site of unspecified female breast: Secondary | ICD-10-CM | POA: Diagnosis not present

## 2014-08-24 DIAGNOSIS — C7931 Secondary malignant neoplasm of brain: Secondary | ICD-10-CM

## 2014-08-24 NOTE — Telephone Encounter (Signed)
Spoke with pt daughter concerning referral from [facility] for pt to resume med onc treatment at [facility]. Scheduled pt for appt with Dr. Lindi Adie on 08/31/14 at 3:45pm.

## 2014-08-24 NOTE — Progress Notes (Signed)
  Radiation Oncology         [phone] ________________________________  Name: OPIE FANTON MRN: [id number]  Date: 08/24/2014  DOB: 05-13-1945   SPECIAL TREATMENT PROCEDURE   3D TREATMENT PLANNING AND DOSIMETRY: The patient's radiation plan was reviewed and approved by Dr. Kathyrn Sheriff  from neurosurgery and radiation oncology prior to treatment. It showed 3-dimensional radiation distributions overlaid onto the planning CT/MRI image set. [facility] for the target structures as well as the organs at risk were reviewed. The documentation of the 3D plan and dosimetry are filed in the radiation oncology EMR.   NARRATIVE: The patient was brought to the TrueBeam stereotactic radiation treatment machine and placed supine on the CT couch. The head frame was applied, and the patient was set up for stereotactic radiosurgery. Neurosurgery was present for the set-up and delivery   SIMULATION VERIFICATION: In the couch zero-angle position, the patient underwent Exactrac imaging using the Brainlab system with orthogonal KV images. These were carefully aligned and repeated to confirm treatment position for each of the isocenters. The Exactrac snap film verification was repeated at each couch angle.   SPECIAL TREATMENT PROCEDURE: The patient received stereotactic radiosurgery to the following targets:  PTV1 Left Temporal 48 mm target was treated using 3 VMAT Arcs to a fractional dose of 8 Gy to be treated 3 times to a total prescription dose of 24 Gy. ExacTrac registration was performed for each couch angle.   STEREOTACTIC TREATMENT MANAGEMENT: Following delivery, the patient was transported to nursing in stable condition and monitored for possible acute effects. Vital signs were recorded  The patient tolerated treatment without significant acute effects, and was discharged to home in stable condition.   PLAN: Follow-up until completed 3 fractions then follow-up in one  month.  ------------------------------------------------  Sheral Apley. Tammi Klippel, M.D.

## 2014-08-24 NOTE — Progress Notes (Signed)
Nurse monitoring complete following SRS treatment. Patient alert and oriented x 3. No distress noted. Denies headache, dizziness, nausea or vision changes. Understands to call [phone] with needs. Understands to return on Thursday at noon for next treatment. Understands to avoid strenuous activity for the next 24 hours. Discharged home with family member. Patient riding in wheelchair.

## 2014-08-27 ENCOUNTER — Encounter: Payer: Self-pay | Admitting: Radiation Oncology

## 2014-08-27 ENCOUNTER — Ambulatory Visit
Admission: RE | Admit: 2014-08-27 | Discharge: 2014-08-27 | Disposition: A | Discharge status: Home / Self Care | Payer: Medicare Other | Source: Ambulatory Visit | Attending: Radiation Oncology | Admitting: Radiation Oncology

## 2014-08-27 VITALS — BP 127/80 | HR 81 | Temp 98.5°F | Resp 20

## 2014-08-27 DIAGNOSIS — C50919 Malignant neoplasm of unspecified site of unspecified female breast: Secondary | ICD-10-CM | POA: Diagnosis not present

## 2014-08-27 DIAGNOSIS — C7931 Secondary malignant neoplasm of brain: Secondary | ICD-10-CM

## 2014-08-27 NOTE — Progress Notes (Signed)
Dexamethasone taper per Dr. Tammi Klippel, Typed and printed instructions and given to daughter as follows: Take 4 mg tablet(1) 3x day for 7 days then 4 mg tablet(1) 2x day x 14 days,then 2mg  tablet (1/2)  Every day x 14 days  Mariah Grimes, brain navigator to call  With next appt,  addressed edema in feet, some confusion from patient per daughter stated , listened to patient abdomen, Positive bowel sounds , daughter requested for patient constipation 1:07 PM

## 2014-08-27 NOTE — Progress Notes (Signed)
Patient arrives s/p SRS tx brain in w/c to room 12 in nursing,patient alert,oriented x3, denied pain, no head aches, nausea, blurred vision,dizzyness ,son and daughter i room with patint, offered fluids blanket,patient declined, duaghter asking about tapering patient's steroids and feet swelling, will continue to monitor patient 30 min and check on daughter's concerns 12:42 PM

## 2014-08-27 NOTE — Progress Notes (Signed)
  Radiation Oncology         [phone] ________________________________  Name: Mariah Grimes MRN: [id number]  Date: 08/27/2014  DOB: September 02, 1945   SPECIAL TREATMENT PROCEDURE   3D TREATMENT PLANNING AND DOSIMETRY: The patient's radiation plan was reviewed and approved by Dr. Kathyrn Sheriff  from neurosurgery and radiation oncology prior to treatment. It showed 3-dimensional radiation distributions overlaid onto the planning CT/MRI image set. The [facility] for the target structures as well as the organs at risk were reviewed. The documentation of the 3D plan and dosimetry are filed in the radiation oncology EMR.   NARRATIVE: The patient was brought to the TrueBeam stereotactic radiation treatment machine and placed supine on the CT couch. The head frame was applied, and the patient was set up for stereotactic radiosurgery. Neurosurgery was present for the set-up and delivery   SIMULATION VERIFICATION: In the couch zero-angle position, the patient underwent Exactrac imaging using the Brainlab system with orthogonal KV images. These were carefully aligned and repeated to confirm treatment position for each of the isocenters. The Exactrac snap film verification was repeated at each couch angle.   SPECIAL TREATMENT PROCEDURE: The patient received stereotactic radiosurgery to the following targets:  PTV1 Left Temporal 48 mm target was treated using 3 VMAT Arcs to a fractional dose of 8 Gy to be treated 3 times to a total prescription dose of 24 Gy. ExacTrac registration was performed for each couch angle.   STEREOTACTIC TREATMENT MANAGEMENT: Following delivery, the patient was transported to nursing in stable condition and monitored for possible acute effects. Vital signs were recorded  The patient tolerated treatment without significant acute effects, and was discharged to home in stable condition.   PLAN: Follow-up until completed 3 fractions then follow-up in one  month.  ------------------------------------------------  Sheral Apley. Tammi Klippel, M.D.

## 2014-08-28 ENCOUNTER — Telehealth: Payer: Self-pay | Admitting: *Deleted

## 2014-08-28 NOTE — Telephone Encounter (Signed)
Spoke to pt daughter Juliene Pina. D/t "loss of abilities" they are cancelling her appt with Dr. Lindi Adie. Informed daughter that if there is a change in status and she would like to pursue medical oncology to please call me. Received verbal understanding. Contact information given.

## 2014-08-31 ENCOUNTER — Ambulatory Visit: Payer: Medicare Other

## 2014-08-31 ENCOUNTER — Ambulatory Visit: Payer: Medicare Other | Admitting: Hematology and Oncology

## 2014-09-02 NOTE — Op Note (Signed)
  Name: Mariah Grimes  MRN: [id number]  Date: 08/21/2014   DOB: 09-26-1945  Stereotactic Radiosurgery Operative Note  PRE-OPERATIVE DIAGNOSIS:  Multiple Brain Metastases  POST-OPERATIVE DIAGNOSIS:  Multiple Brain Metastases  PROCEDURE:  Stereotactic Radiosurgery  SURGEON:  Jairo Ben, MD  RADIATION ONCOLOGIST: Dr. Kyung Rudd, MD  NARRATIVE: The patient underwent a radiation treatment planning session in the radiation oncology simulation suite under the care of the radiation oncology physician and physicist.  I participated closely in the radiation treatment planning afterwards. The patient underwent planning CT which was fused to 3T high resolution MRI with 1 mm axial slices.  These images were fused on the planning system.  We contoured the gross target volumes and subsequently expanded this to yield the Planning Target Volume. I actively participated in the planning process.  I helped to define and review the target contours and also the contours of the optic pathway, eyes, brainstem and selected nearby organs at risk.  All the dose constraints for critical structures were reviewed and compared to AAPM Task Group 101.  The prescription dose conformity was reviewed.  I approved the plan electronically.    Accordingly, Mariah Grimes was brought to the TrueBeam stereotactic radiation treatment linac and placed in the custom immobilization mask.  The patient was aligned according to the IR fiducial markers with BrainLab Exactrac, then orthogonal x-rays were used in ExacTrac with the 6DOF robotic table and the shifts were made to align the patient  Mariah Grimes received stereotactic radiosurgery uneventfully.    Lesions treated:  8 including: 3 right frontal lesions to 20Gy Left temporo-occipital to 20Gy Left temporal to 20 Gy Left parietal to 20 Gy Left frontal to 20Gy Left ganglionic lesion, first of 3 fractions to 8 Gy (24Gy total after 3 fractions)  The detailed description  of the procedure is recorded in the radiation oncology procedure note.  I was present for the duration of the procedure.  DISPOSITION:  Following delivery, the patient was transported to nursing in stable condition and monitored for possible acute effects to be discharged to home in stable condition with follow-up in one month.  Consuella Lose, Loletha Grayer, MD 09/02/2014 8:20 AM

## 2014-09-02 NOTE — Addendum Note (Signed)
Encounter addended by: Consuella Lose, MD on: 09/02/2014  8:25 AM<BR>     Documentation filed: Notes Section

## 2014-09-03 ENCOUNTER — Encounter: Payer: Self-pay | Admitting: Radiation Therapy

## 2014-09-03 NOTE — Progress Notes (Signed)
I spoke with Juliene Pina, Mariah Grimes's daughter today. She said that [name] experienced some confusion with word finding difficulties and weakness in her legs preventing her from standing from a seated position after her last SRS fraction. She said that over the past 2 days her mom has been doing better and seems less confused, but she is still unable to stand from a seated position or walk without the assistance of her walker. Today is the first day of 4mg  bid for her steroid taper.        I let her know that I will pass this information on to Dr. Lisbeth Renshaw and asked her to please call back if her mother does not continue to improve, or shows worsening symptoms. She seemed satisfied with this plan.   Mont Dutton RTRT Radiation Special Procedures Navigator

## 2014-09-25 ENCOUNTER — Telehealth: Payer: Self-pay | Admitting: *Deleted

## 2014-09-25 NOTE — Telephone Encounter (Signed)
Mariah Grimes tried to transfer call for vm from [facility],didn't go through, called pateint home phone no answer 10:18 AM'

## 2014-09-25 NOTE — Telephone Encounter (Signed)
Called patient home spoke with daughter-in-law Anderson Malta, and gave instructins to increase patient's dexamethasone(decadron) to 1 tablet daily over the weekend and to give [place] a call on Monday to let us know how patient is doing, Anderson Malta gave verbal understanding, teach back 3:30 PM

## 2014-09-29 ENCOUNTER — Telehealth: Payer: Self-pay | Admitting: *Deleted

## 2014-09-29 NOTE — Telephone Encounter (Signed)
Myra called back stating patien tis still taking the full tablet hasn't cut back the decadron  1/2 tablet, I spoke and stated I would let Dr. Lisbeth Renshaw know and will call tomorrow if he wants to cahnge anything 4:24 PM

## 2014-09-29 NOTE — Telephone Encounter (Signed)
Called daughter after hearing vm on phone, patient was given 2 dieurtics per Hosp[facility] the other day, and patient started feeling better and more lucid  Friday night and Saturday morning before she started the 1 tablet of decadron, so on Monday she went back to 1/2 tab of decadron, , she is doing better that way,  Daughter stated  Patient husband doesn't think she needs to come in for follow up appt,very difficult to get her here, will let MD know when he is back in office tomorrow 2:26 PM

## 2014-09-30 ENCOUNTER — Telehealth: Payer: Self-pay | Admitting: *Deleted

## 2014-09-30 NOTE — Telephone Encounter (Signed)
[facility] called requesting refill of dexamethasone for patient ,her phone number [phone] is the [facility]ospice office number, they will contact patty to call back  , Patty called back stating patient has enough decadron to last through till tomorrow, aware MD is out office now, will inbasket him and will check status  In am 1:58 PM

## 2014-10-01 ENCOUNTER — Encounter: Payer: Self-pay | Admitting: *Deleted

## 2014-10-01 ENCOUNTER — Telehealth: Payer: Self-pay | Admitting: *Deleted

## 2014-10-01 NOTE — Telephone Encounter (Signed)
Called patient daughter Mariah Grimes ,refilling dexamethasone take 4mg  tablet(1tab)daily , dispense 30 tabs only no refill, She then asked about cancelling next week follow up ,difficulty bringing  Here, I wil ask Md  In between patients and let her know, , then called [facility] and  Left voice message  For rx  Dexamethadone :take 1 tablet 4mg  daily, dispense 30 tabs no refill call me for any questions at [phone] 4:20 PM

## 2014-10-01 NOTE — Telephone Encounter (Signed)
Returned call to daughter Juliene Pina from her voice message on my phone, she had 2 concerns, 1. Patient's decadron will run out tonight, and she doesn't want her mom to just go off the decadron without tapering again, 2. About the follow up appt on 10/07/14, I told Myra that Dr. Lisbeth Renshaw is in another office until 4pm today and I will call her after speaking with him,  1:19 PM

## 2014-10-01 NOTE — Telephone Encounter (Signed)
error 

## 2014-10-01 NOTE — Telephone Encounter (Signed)
Called daughter  Juliene Pina, per Dr. Lisbeth Renshaw can cancel patient's  follow up next week, don't hesitate to call us for as needed appts,daughter stated thank Dr. Lisbeth Renshaw so Much, We will have Earleen Newport cancel next weeks follow up appt 5:03 PM

## 2014-10-07 ENCOUNTER — Ambulatory Visit: Payer: Medicare Other | Admitting: Radiation Oncology

## 2014-11-07 DEATH — deceased

## 2014-11-12 NOTE — Addendum Note (Signed)
Encounter addended by: Kyung Rudd, MD on: 11/12/2014  8:19 PM<BR>     Documentation filed: Notes Section, Visit Diagnoses

## 2014-11-12 NOTE — Addendum Note (Signed)
Encounter addended by: Kyung Rudd, MD on: 11/12/2014  8:16 PM<BR>     Documentation filed: Notes Section

## 2014-11-12 NOTE — Progress Notes (Signed)
  Radiation Oncology         [phone] ________________________________  Name: SHARISSE RANTZ MRN: [id number]  Date: 08/21/2014  DOB: 1945/10/19   SPECIAL TREATMENT PROCEDURE   3D TREATMENT PLANNING AND DOSIMETRY: The patient's radiation plan was reviewed and approved by Dr. Elenor Legato from neurosurgery and radiation oncology prior to treatment. It showed 3-dimensional radiation distributions overlaid onto the planning CT/MRI image set. The [facility] for the target structures as well as the organs at risk were reviewed. The documentation of the 3D plan and dosimetry are filed in the radiation oncology EMR.   NARRATIVE: The patient was brought to the TrueBeam stereotactic radiation treatment machine and placed supine on the CT couch. The head frame was applied, and the patient was set up for stereotactic radiosurgery. Neurosurgery was present for the set-up and delivery   SIMULATION VERIFICATION: In the couch zero-angle position, the patient underwent Exactrac imaging using the Brainlab system with orthogonal KV images. These were carefully aligned and repeated to confirm treatment position for each of the isocenters. The Exactrac snap film verification was repeated at each couch angle.   SPECIAL TREATMENT PROCEDURE: The patient received stereotactic radiosurgery to the following targets:  1.  PTV1 Lt temporal target was treated using 3 Arcs to a prescription dose of 8 Gy. ExacTrac Snap verification was performed for each couch angle.  2.  8 additional target Lesions Weretreated using 24 Arcs to a prescription dose of 20 Gy. ExacTrac Snap verification was performed for each couch angle.  STEREOTACTIC TREATMENT MANAGEMENT: Following delivery, the patient was transported to nursing in stable condition and monitored for possible acute effects. Vital signs were recorded . The patient tolerated treatment without significant acute effects, and was discharged to home in stable condition.  PLAN: The patient  will continue with additional radiosurgery to the largest lesion to yield a final dose of 24 gray in 3 fractions.   ------------------------------------------------  Jodelle Gross, MD, PhD

## 2014-11-12 NOTE — Progress Notes (Signed)
  Radiation Oncology         [phone] ________________________________  Name: Mariah Grimes MRN: [id number]  Date: 08/17/2014  DOB: 03/11/1945  SIMULATION AND TREATMENT PLANNING NOTE  DIAGNOSIS:  Metastatic breast cancer  NARRATIVE:  The patient was brought to the [facility].  Identity was confirmed.  All relevant records and images related to the planned course of therapy were reviewed.  The patient freely provided informed written consent to proceed with treatment after reviewing the details related to the planned course of therapy. The consent form was witnessed and verified by the simulation staff. Intravenous access was established for contrast administration. Then, the patient was set-up in a stable reproducible supine position for radiation therapy.  A relocatable thermoplastic stereotactic head frame was fabricated for precise immobilization.  CT images were obtained.  Surface markings were placed.  The CT images were loaded into the planning software and fused with the patient's targeting MRI scan.  Then the target and avoidance structures were contoured.  Treatment planning then occurred.  The radiation prescription was entered and confirmed.  I have requested 3D planning  I have requested a DVH of the following structures: Brain stem, brain, left eye, right I, lenses, optic chiasm, target volumes, uninvolved brain, and normal tissue.    PLAN:  The patient will receive 24 Gy in 3 fractions to the largest lesion in the left temporal Lobe.  The patient will also receive 20 gray in 1 fraction to 8 additional metastases using a single isocenter technique simultaneously.   Special treatment procedure The patient will be treated with a course of radiosurgery. This requires extremely precise localization of the target and sophisticated, labor intensive treatment planning to achieve a highly focused radiation treatment plan. Due to the extra work involved in planning and  delivery of such a procedure, this corresponds to a special treatment procedure.   Addendum The patient will be treated with a three-field technique to the largest lesion. The additional 8 metastases will be treated simultaneously using a total of 24 fields at 4 different couch angles. ________________________________  Jodelle Gross, MD, PhD

## 2014-11-12 NOTE — Progress Notes (Signed)
  Radiation Oncology         [phone] ________________________________  Name: Mariah Grimes MRN: [id number]  Date: 08/27/2014  DOB: [birth year]  End of Treatment Note  Diagnosis:   Metastatic breast cancer with brain metastasis     Indication for treatment::  palliative       Radiation treatment dates:   08/21/2014 through 08/27/2014  Site/dose:    1. PTV1 Lt temporal target was treated using 3 Arcs to a prescription dose of 24 Gy In 3 fractions. ExacTrac Snap verification was performed for each couch angle.  2. 8 additional target Lesions Weretreated using 24 Arcs to a prescription dose of 20 Gy. ExacTrac Snap verification was performed for each couch angle.  Narrative: The patient tolerated radiation treatment And completed her prescribed course of treatment.    Plan: The patient has completed radiation treatment. The patient will return to radiation oncology clinic for routine followup in one month. I advised the patient to call or return sooner if they have any questions or concerns related to their recovery or treatment. ________________________________  Jodelle Gross, M.D., Ph.D.
